# Patient Record
Sex: Male | Born: 1975
Health system: Southern US, Community
[De-identification: ages and names within clinical notes are randomized; demographics above are authoritative.]

## PROBLEM LIST (undated history)

## (undated) DIAGNOSIS — R569 Unspecified convulsions: Secondary | ICD-10-CM

## (undated) HISTORY — DX: Unspecified convulsions: R56.9

---

## 2006-01-28 ENCOUNTER — Emergency Department (HOSPITAL_COMMUNITY): Admission: EM | Admit: 2006-01-28 | Discharge: 2006-01-28 | Payer: Self-pay | Admitting: Family Medicine

## 2006-07-25 ENCOUNTER — Ambulatory Visit: Payer: Self-pay | Admitting: Family Medicine

## 2006-07-31 ENCOUNTER — Ambulatory Visit: Payer: Self-pay | Admitting: Family Medicine

## 2006-08-13 ENCOUNTER — Ambulatory Visit: Payer: Self-pay | Admitting: Family Medicine

## 2006-11-06 ENCOUNTER — Ambulatory Visit: Payer: Self-pay | Admitting: Family Medicine

## 2007-07-09 ENCOUNTER — Ambulatory Visit: Payer: Self-pay | Admitting: Family Medicine

## 2009-12-21 ENCOUNTER — Ambulatory Visit: Payer: Self-pay | Admitting: Family Medicine

## 2010-01-24 ENCOUNTER — Ambulatory Visit: Payer: Self-pay | Admitting: Family Medicine

## 2010-02-22 ENCOUNTER — Ambulatory Visit
Admission: RE | Admit: 2010-02-22 | Discharge: 2010-02-22 | Payer: Self-pay | Source: Home / Self Care | Attending: Family Medicine | Admitting: Family Medicine

## 2012-05-09 ENCOUNTER — Encounter: Payer: Self-pay | Admitting: Family Medicine

## 2012-05-09 ENCOUNTER — Ambulatory Visit (INDEPENDENT_AMBULATORY_CARE_PROVIDER_SITE_OTHER): Payer: BC Managed Care – PPO | Admitting: Family Medicine

## 2012-05-09 VITALS — BP 110/70 | HR 60 | Temp 98.3°F | Wt 157.0 lb

## 2012-05-09 DIAGNOSIS — S0592XA Unspecified injury of left eye and orbit, initial encounter: Secondary | ICD-10-CM

## 2012-05-09 DIAGNOSIS — S0510XA Contusion of eyeball and orbital tissues, unspecified eye, initial encounter: Secondary | ICD-10-CM

## 2012-05-09 DIAGNOSIS — J209 Acute bronchitis, unspecified: Secondary | ICD-10-CM

## 2012-05-09 MED ORDER — AZITHROMYCIN 500 MG PO TABS: 500.0000 mg | ORAL_TABLET | Freq: Every day | ORAL | Status: DC

## 2012-05-09 NOTE — Patient Instructions (Signed)
Take the medicine for the next 3 days and if still having symptoms in one week, call me.. Good hand washing is important Also to let anybody eat or drink after you.

## 2012-05-09 NOTE — Progress Notes (Signed)
  Subjective:    Patient ID: Paul Maxwell, male    DOB: Apr 15, 1975, 37 y.o.   MRN: [id number]  HPI He is here for evaluation of a left eye injury that occurred 6 days ago. Initially he had a discomfort but no pain and a clear discharge from his eyes. No blurred or double vision,however he does have light sensitivity causing a headache. He has used OTC eye drops. Since Saturday he has had rhinorrhea, sneezing and now has a dry cough. He also has had some difficulty with mild headache in the temporal and occipital. He has had some chills and did take his temperature last night which was 101. He states the cough is different than normal when he gets with a URI. He has no known allergies and does not smoke.   Review of Systems     Objective:   Physical Exam alert and in no distress. Tympanic membranes and canals are normal. Throat is clear. Tonsils are normal. Neck is supple without adenopathy or thyromegaly. Cardiac exam shows a regular sinus rhythm without murmurs or gallops. Lungs are clear to auscultation.the left conjunctiva is normal in appearance. Cornea and anterior chamber are normal.        Assessment & Plan:  Acute bronchitis - Plan: azithromycin (ZITHROMAX) 500 MG tablet  Eye trauma, left, initial encounter he is to call me in one week if his congestion and coughing are still present. Explained that his eye has healed and no further therapy necessary. Also discussed good hygiene in regard to potentially infecting other people.

## 2017-05-07 ENCOUNTER — Ambulatory Visit (INDEPENDENT_AMBULATORY_CARE_PROVIDER_SITE_OTHER): Payer: 59 | Admitting: Family Medicine

## 2017-05-07 ENCOUNTER — Encounter: Payer: Self-pay | Admitting: Family Medicine

## 2017-05-07 VITALS — BP 112/72 | HR 73 | Temp 97.5°F | Ht 71.0 in | Wt 160.8 lb

## 2017-05-07 DIAGNOSIS — Z0000 Encounter for general adult medical examination without abnormal findings: Secondary | ICD-10-CM

## 2017-05-07 DIAGNOSIS — Z23 Encounter for immunization: Secondary | ICD-10-CM | POA: Diagnosis not present

## 2017-05-07 LAB — POCT URINALYSIS DIP (PROADVANTAGE DEVICE)
Bilirubin, UA: NEGATIVE
Blood, UA: NEGATIVE
Glucose, UA: NEGATIVE mg/dL
Ketones, POC UA: NEGATIVE mg/dL
LEUKOCYTES UA: NEGATIVE
NITRITE UA: NEGATIVE
PROTEIN UA: NEGATIVE mg/dL
Specific Gravity, Urine: 1.015
UUROB: 3.5
pH, UA: 6 (ref 5.0–8.0)

## 2017-05-07 LAB — CBC WITH DIFFERENTIAL/PLATELET
BASOS: 0 %
Basophils Absolute: 0 10*3/uL (ref 0.0–0.2)
EOS (ABSOLUTE): 0.1 10*3/uL (ref 0.0–0.4)
Eos: 1 %
Hematocrit: 45.4 % (ref 37.5–51.0)
Hemoglobin: 16 g/dL (ref 13.0–17.7)
IMMATURE GRANS (ABS): 0 10*3/uL (ref 0.0–0.1)
Immature Granulocytes: 0 %
LYMPHS ABS: 1.3 10*3/uL (ref 0.7–3.1)
Lymphs: 29 %
MCH: 30.8 pg (ref 26.6–33.0)
MCHC: 35.2 g/dL (ref 31.5–35.7)
MCV: 88 fL (ref 79–97)
MONOS ABS: 0.3 10*3/uL (ref 0.1–0.9)
Monocytes: 7 %
NEUTROS ABS: 2.8 10*3/uL (ref 1.4–7.0)
Neutrophils: 63 %
PLATELETS: 212 10*3/uL (ref 150–379)
RBC: 5.19 x10E6/uL (ref 4.14–5.80)
RDW: 13.2 % (ref 12.3–15.4)
WBC: 4.5 10*3/uL (ref 3.4–10.8)

## 2017-05-07 LAB — COMPREHENSIVE METABOLIC PANEL
A/G RATIO: 1.8 (ref 1.2–2.2)
ALT: 12 IU/L (ref 0–44)
AST: 18 IU/L (ref 0–40)
Albumin: 4.7 g/dL (ref 3.5–5.5)
Alkaline Phosphatase: 44 IU/L (ref 39–117)
BILIRUBIN TOTAL: 0.5 mg/dL (ref 0.0–1.2)
BUN/Creatinine Ratio: 13 (ref 9–20)
BUN: 15 mg/dL (ref 6–24)
CHLORIDE: 100 mmol/L (ref 96–106)
CO2: 25 mmol/L (ref 20–29)
Calcium: 9.4 mg/dL (ref 8.7–10.2)
Creatinine, Ser: 1.14 mg/dL (ref 0.76–1.27)
GFR calc non Af Amer: 80 mL/min/{1.73_m2} (ref >59)
GFR, EST AFRICAN AMERICAN: 92 mL/min/{1.73_m2} (ref >59)
Globulin, Total: 2.6 g/dL (ref 1.5–4.5)
Glucose: 90 mg/dL (ref 65–99)
POTASSIUM: 5.2 mmol/L (ref 3.5–5.2)
Sodium: 141 mmol/L (ref 134–144)
TOTAL PROTEIN: 7.3 g/dL (ref 6.0–8.5)

## 2017-05-07 LAB — LIPID PANEL
Chol/HDL Ratio: 3.4 ratio (ref 0.0–5.0)
Cholesterol, Total: 229 mg/dL — ABNORMAL HIGH (ref 100–199)
HDL: 68 mg/dL (ref >39)
LDL Calculated: 147 mg/dL — ABNORMAL HIGH (ref 0–99)
Triglycerides: 68 mg/dL (ref 0–149)
VLDL Cholesterol Cal: 14 mg/dL (ref 5–40)

## 2017-05-07 NOTE — Progress Notes (Signed)
   Subjective:    Patient ID: Paul Maxwell, male    DOB: 01/05/1976, 42 y.o.   MRN: [id number]  HPI He is here for a complete examination.  He has enjoyed excellent health.  He has no particular concerns or complaints.  He does have 2 children and is scheduled for vasectomy in the near future.  His work and home life are going well.  He has no allergy symptoms, GI or cardiac issues.  Family and social history as well as health maintenance and immunizations was reviewed.  He keeps himself physically active.   Review of Systems  All other systems reviewed and are negative.      Objective:   Physical Exam BP 112/72 (BP Location: Left Arm, Patient Position: Sitting)   Pulse 73   Temp (!) 97.5 F (36.4 C)   Ht 5\' 11"  (1.803 m)   Wt 160 lb 12.8 oz (72.9 kg)   SpO2 99%   BMI 22.43 kg/m   General Appearance:    Alert, cooperative, no distress, appears stated age  Head:    Normocephalic, without obvious abnormality, atraumatic  Eyes:    PERRL, conjunctiva/corneas clear, EOM's intact, fundi    benign  Ears:    Normal TM's and external ear canals  Nose:   Nares normal, mucosa normal, no drainage or sinus   tenderness  Throat:   Lips, mucosa, and tongue normal; teeth and gums normal  Neck:   Supple, no lymphadenopathy;  thyroid:  no   enlargement/tenderness/nodules; no carotid   bruit or JVD     Lungs:     Clear to auscultation bilaterally without wheezes, rales or     ronchi; respirations unlabored      Heart:    Regular rate and rhythm, S1 and S2 normal, no murmur, rub   or gallop     Abdomen:     Soft, non-tender, nondistended, normoactive bowel sounds,    no masses, no hepatosplenomegaly  Genitalia:    Normal male external genitalia without lesions.  Testicles without masses.  No inguinal hernias.  Rectal:   Deferred.  Guaiac cards given  Extremities:   No clubbing, cyanosis or edema  Pulses:   2+ and symmetric all extremities  Skin:   Skin color, texture, turgor normal, no  rashes or lesions  Lymph nodes:   Cervical, supraclavicular, and axillary nodes normal  Neurologic:   CNII-XII intact, normal strength, sensation and gait; reflexes 2+ and symmetric throughout          Psych:   Normal mood, affect, hygiene and grooming.           Assessment & Plan:  Routine general medical examination at a health care facility - Plan: CBC with Differential/Platelet, Comprehensive metabolic panel, Lipid panel  Need for Tdap vaccination - Plan: Tdap vaccine greater than or equal to 7yo IM  Encouraged him to continue to take good care of himself.

## 2017-05-24 DIAGNOSIS — Z302 Encounter for sterilization: Secondary | ICD-10-CM | POA: Diagnosis not present

## 2018-08-19 ENCOUNTER — Ambulatory Visit (INDEPENDENT_AMBULATORY_CARE_PROVIDER_SITE_OTHER): Payer: 59 | Admitting: Medical

## 2018-08-19 ENCOUNTER — Telehealth: Payer: Self-pay | Admitting: *Deleted

## 2018-08-19 ENCOUNTER — Other Ambulatory Visit: Payer: Self-pay

## 2018-08-19 ENCOUNTER — Encounter: Payer: Self-pay | Admitting: Medical

## 2018-08-19 VITALS — Temp 98.4°F | Ht 72.0 in | Wt 155.0 lb

## 2018-08-19 DIAGNOSIS — R5383 Other fatigue: Secondary | ICD-10-CM

## 2018-08-19 DIAGNOSIS — R52 Pain, unspecified: Secondary | ICD-10-CM | POA: Diagnosis not present

## 2018-08-19 DIAGNOSIS — R42 Dizziness and giddiness: Secondary | ICD-10-CM

## 2018-08-19 DIAGNOSIS — Z20822 Contact with and (suspected) exposure to covid-19: Secondary | ICD-10-CM

## 2018-08-19 DIAGNOSIS — J029 Acute pharyngitis, unspecified: Secondary | ICD-10-CM

## 2018-08-19 DIAGNOSIS — R0600 Dyspnea, unspecified: Secondary | ICD-10-CM

## 2018-08-19 DIAGNOSIS — R110 Nausea: Secondary | ICD-10-CM

## 2018-08-19 NOTE — Telephone Encounter (Signed)
Pt scheduled for covid testing 08/20/18 @ 8:30 @ [facility]. Instructions given and order placed

## 2018-08-19 NOTE — Telephone Encounter (Signed)
-----   Message from Carlena Hurl, PA-C sent at 08/19/2018 11:25 AM EDT ----- Regarding: covid testing Please call patient for Covid19 testing.  Lethargy, mild shortness of breath, sore throat, decreased appetite, dizzy, body ache, nausea x 3 days

## 2018-08-19 NOTE — Progress Notes (Signed)
Subjective:     Patient ID: Paul Maxwell, male   DOB: [birth year], 43 y.o.   MRN: [id number]  This visit type was conducted due to national recommendations for restrictions regarding the COVID-19 Pandemic (e.g. social distancing) in an effort to limit this patient's exposure and mitigate transmission in our community.  Due to their co-morbid illnesses, this patient is at least at moderate risk for complications without adequate follow up.  This format is felt to be most appropriate for this patient at this time.    Documentation for virtual audio and video telecommunications through Zoom encounter:  The patient was located at home. The provider was located in the office. The patient did consent to this visit and is aware of possible charges through their insurance for this visit.  The other persons participating in this telemedicine service were none. Time spent on call was 15 minutes and in review of previous records >15 minutes total.  This virtual service is not related to other E/M service within previous 7 days.   HPI Chief Complaint  Patient presents with  . headache    headache, nausea,dizzy, back pain, tightness chest, sob,  fatigue and loss of appetite X Friday    Virtual consult today for not feeling well.  He reports 3-day history of feeling lethargic, no energy, decreased appetite, sore throat on the left, low back pain and body aches, mild shortness of breath, nausea, dizziness.  Denies fever, no cough, no decrease in smell or taste.  No loose stool.  No other symptoms.  He manages to [place].  He denies specific sick contacts.  He has 2 children at home ages 44 and 8 years old.  His 62-year-old was at [place] about 2 weeks ago but was not sick.  No strep throat contacts.  No recent tick bites.  No rash.  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  This was with a group of 4 other people.  He also interacts  with homeless people [place] on a regular basis.  He is wearing a mask indoors at work but not necessarily outside.  His father was diagnosed with coronavirus back in April, but he did not get exposed as far as he knows.  No other aggravating or relieving factors. No other complaint.  No past medical history on file. No current outpatient medications on file prior to visit.   No current facility-administered medications on file prior to visit.      Review of Systems As in subjective    Objective:   Physical Exam Due to coronavirus pandemic stay at home measures, patient visit was virtual and they were not examined in person.   Temp 98.4 F (36.9 C) (Oral)   Ht 6' (1.829 m)   Wt 155 lb (70.3 kg)   BMI 21.02 kg/m   Gen: wd, wn, nad, white male     Assessment:     Encounter Diagnoses  Name Primary?  Marland Kitchen Dyspnea, unspecified type Yes  . Fatigue, unspecified type   . Dizziness   . Body aches   . Sore throat   . Nausea        Plan:     We discussed symptoms that could suggest viral pharyngitis vs other viral syndrome vs other.  He will send me throat pic through email.  I think tonsillitis vs strep throat seems less likely.  These symptoms could be Covid19 related.  I am concerned given his exposure to homeless population down town without a mask, and give his exposure doing CPR breathing techniques in a closed room last week during CPR class.   I will place order [facility] Covid testing [facility].    General recommendations: I recommend you rest, hydrate well with water and clear fluids throughout the day.  You can use Tylenol for pain or fever.  We will await test results.   If we get back a negative test result, we will call back with other recommendations and plan to return to work.      Self Quarantine: In general I recommend you avoid contact with people as much as possible for the foreseeable next 2 weeks if you test positive.   Particularly in your  house, isolate your self from others in a separate room, wear a mask when possible in the room, particularly if coughing a lot.   Have others bring food, water, medications, etc., to your door but avoid direct contact with your household contacts during this time to avoid spreading the infection to them.   If you have a separate bathroom and living quarters during the next 2 weeks away from others, that would be preferable.    If you can't completely isolate, then wear a mask, wash hands frequently with soap and water for at least 15 seconds, minimize close contact with others, and have a friend or family member check regularly from a distance to make sure you are not getting seriously worse.     You should not be going out in public, should not be going to stores, to work or other public places until all your symptoms have resolved and at least 7 days + 72 hours of no symptoms at all have transpired.   Ideally you should avoid contact with others for a full 14 days if possible.  One of the goals is to limit spread to high risk people; people that are older and elderly, people with multiple health issues like diabetes, heart disease, lung disease, and anybody that has weakened immune systems such as people with cancer or on immunosuppressive therapy.   Please call back if you have questions and concerns.     Jill AlexandersJustin was seen today for headache.  Diagnoses and all orders for this visit:  Dyspnea, unspecified type  Fatigue, unspecified type  Dizziness  Body aches  Sore throat  Nausea

## 2018-08-20 ENCOUNTER — Other Ambulatory Visit: Payer: Self-pay

## 2018-08-20 DIAGNOSIS — Z20822 Contact with and (suspected) exposure to covid-19: Secondary | ICD-10-CM

## 2018-08-26 ENCOUNTER — Telehealth: Payer: Self-pay | Admitting: Medical

## 2018-08-26 NOTE — Telephone Encounter (Signed)
Please check on Covid test result?

## 2018-08-27 LAB — NOVEL CORONAVIRUS, NAA: SARS-CoV-2, NAA: NOT DETECTED

## 2018-08-27 NOTE — Telephone Encounter (Signed)
No test results as of yet.   They are behind on testing.

## 2018-08-27 NOTE — Telephone Encounter (Signed)
Left message for patient to let him know that we called about his Covid testing and its not back yet.

## 2018-08-27 NOTE — Telephone Encounter (Signed)
Please let him know we called to check

## 2018-10-10 ENCOUNTER — Other Ambulatory Visit (INDEPENDENT_AMBULATORY_CARE_PROVIDER_SITE_OTHER): Payer: 59

## 2018-10-10 ENCOUNTER — Other Ambulatory Visit: Payer: Self-pay

## 2018-10-10 DIAGNOSIS — Z23 Encounter for immunization: Secondary | ICD-10-CM | POA: Diagnosis not present

## 2019-07-24 ENCOUNTER — Emergency Department (HOSPITAL_COMMUNITY): Payer: 59

## 2019-07-24 ENCOUNTER — Emergency Department (HOSPITAL_COMMUNITY)
Admission: EM | Admit: 2019-07-24 | Discharge: 2019-07-24 | Disposition: A | Discharge status: Home / Self Care | Payer: 59 | Attending: Emergency Medicine | Admitting: Emergency Medicine

## 2019-07-24 ENCOUNTER — Encounter (HOSPITAL_COMMUNITY): Payer: Self-pay

## 2019-07-24 ENCOUNTER — Ambulatory Visit (HOSPITAL_COMMUNITY)
Admission: EM | Admit: 2019-07-24 | Discharge: 2019-07-24 | Disposition: A | Discharge status: Home / Self Care | Payer: Self-pay

## 2019-07-24 ENCOUNTER — Other Ambulatory Visit: Payer: Self-pay

## 2019-07-24 DIAGNOSIS — Y9355 Activity, bike riding: Secondary | ICD-10-CM | POA: Insufficient documentation

## 2019-07-24 DIAGNOSIS — M79641 Pain in right hand: Secondary | ICD-10-CM | POA: Insufficient documentation

## 2019-07-24 DIAGNOSIS — Y998 Other external cause status: Secondary | ICD-10-CM | POA: Insufficient documentation

## 2019-07-24 DIAGNOSIS — Y92838 Other recreation area as the place of occurrence of the external cause: Secondary | ICD-10-CM | POA: Insufficient documentation

## 2019-07-24 DIAGNOSIS — S022XXA Fracture of nasal bones, initial encounter for closed fracture: Secondary | ICD-10-CM | POA: Diagnosis not present

## 2019-07-24 DIAGNOSIS — S0181XA Laceration without foreign body of other part of head, initial encounter: Secondary | ICD-10-CM | POA: Insufficient documentation

## 2019-07-24 DIAGNOSIS — W19XXXA Unspecified fall, initial encounter: Secondary | ICD-10-CM

## 2019-07-24 DIAGNOSIS — Z23 Encounter for immunization: Secondary | ICD-10-CM | POA: Diagnosis not present

## 2019-07-24 DIAGNOSIS — S0990XA Unspecified injury of head, initial encounter: Secondary | ICD-10-CM | POA: Diagnosis present

## 2019-07-24 MED ORDER — ACETAMINOPHEN 325 MG PO TABS
650.0000 mg | ORAL_TABLET | Freq: Once | ORAL | Status: AC
  Administered 2019-07-24: 650 mg via ORAL
  Filled 2019-07-24: qty 2

## 2019-07-24 MED ORDER — OXYMETAZOLINE HCL 0.05 % NA SOLN
1.0000 | Freq: Once | NASAL | Status: DC
  Filled 2019-07-24: qty 30

## 2019-07-24 MED ORDER — TETANUS-DIPHTH-ACELL PERTUSSIS 5-2.5-18.5 LF-MCG/0.5 IM SUSP
0.5000 mL | Freq: Once | INTRAMUSCULAR | Status: AC
  Administered 2019-07-24: 0.5 mL via INTRAMUSCULAR
  Filled 2019-07-24: qty 0.5
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
## 2019-07-24 NOTE — Discharge Instructions (Signed)
Follow up with the ear nose and throat doctor next week.   Please return to the emergency department for any new or worsening symptoms.
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
## 2021-03-10 ENCOUNTER — Other Ambulatory Visit: Payer: Self-pay

## 2021-03-10 ENCOUNTER — Emergency Department (HOSPITAL_COMMUNITY)
Admission: EM | Admit: 2021-03-10 | Discharge: 2021-03-11 | Disposition: A | Discharge status: Home / Self Care | Payer: 59 | Attending: Emergency Medicine | Admitting: Emergency Medicine

## 2021-03-10 DIAGNOSIS — R1031 Right lower quadrant pain: Secondary | ICD-10-CM | POA: Insufficient documentation

## 2021-03-10 DIAGNOSIS — R1030 Lower abdominal pain, unspecified: Secondary | ICD-10-CM

## 2021-03-10 LAB — URINALYSIS, ROUTINE W REFLEX MICROSCOPIC
Bilirubin Urine: NEGATIVE
Glucose, UA: NEGATIVE mg/dL
Hgb urine dipstick: NEGATIVE
Ketones, ur: NEGATIVE mg/dL
Leukocytes,Ua: NEGATIVE
Nitrite: NEGATIVE
Protein, ur: NEGATIVE mg/dL
Specific Gravity, Urine: 1.023 (ref 1.005–1.030)
pH: 7 (ref 5.0–8.0)

## 2021-03-10 LAB — CBC WITH DIFFERENTIAL/PLATELET
Abs Immature Granulocytes: 0.01 10*3/uL (ref 0.00–0.07)
Basophils Absolute: 0 10*3/uL (ref 0.0–0.1)
Basophils Relative: 1 %
Eosinophils Absolute: 0.2 10*3/uL (ref 0.0–0.5)
Eosinophils Relative: 3 %
HCT: 43.3 % (ref 39.0–52.0)
Hemoglobin: 14.7 g/dL (ref 13.0–17.0)
Immature Granulocytes: 0 %
Lymphocytes Relative: 24 %
Lymphs Abs: 1.4 10*3/uL (ref 0.7–4.0)
MCH: 30.5 pg (ref 26.0–34.0)
MCHC: 33.9 g/dL (ref 30.0–36.0)
MCV: 89.8 fL (ref 80.0–100.0)
Monocytes Absolute: 0.5 10*3/uL (ref 0.1–1.0)
Monocytes Relative: 9 %
Neutro Abs: 3.6 10*3/uL (ref 1.7–7.7)
Neutrophils Relative %: 63 %
Platelets: 190 10*3/uL (ref 150–400)
RBC: 4.82 MIL/uL (ref 4.22–5.81)
RDW: 12.4 % (ref 11.5–15.5)
WBC: 5.8 10*3/uL (ref 4.0–10.5)
nRBC: 0 % (ref 0.0–0.2)

## 2021-03-10 LAB — COMPREHENSIVE METABOLIC PANEL
ALT: 15 U/L (ref 0–44)
AST: 21 U/L (ref 15–41)
Albumin: 4.2 g/dL (ref 3.5–5.0)
Alkaline Phosphatase: 48 U/L (ref 38–126)
Anion gap: 11 (ref 5–15)
BUN: 18 mg/dL (ref 6–20)
CO2: 24 mmol/L (ref 22–32)
Calcium: 9.3 mg/dL (ref 8.9–10.3)
Chloride: 102 mmol/L (ref 98–111)
Creatinine, Ser: 1.02 mg/dL (ref 0.61–1.24)
GFR, Estimated: >60 mL/min (ref >60)
Glucose, Bld: 147 mg/dL — ABNORMAL HIGH (ref 70–99)
Potassium: 3.8 mmol/L (ref 3.5–5.1)
Sodium: 137 mmol/L (ref 135–145)
Total Bilirubin: 0.7 mg/dL (ref 0.3–1.2)
Total Protein: 6.6 g/dL (ref 6.5–8.1)

## 2021-03-10 LAB — LIPASE, BLOOD: Lipase: 73 U/L — ABNORMAL HIGH (ref 11–51)

## 2021-03-10 NOTE — ED Provider Triage Note (Signed)
Emergency Medicine Provider Triage Evaluation Note  MAGNUM LUNDE , a 46 y.o. male  was evaluated in triage.  Pt complains of lower abdominal pain that began this evening.  Some nausea but no vomiting.  No prior surgeries.  Otherwise healthy.  Review of Systems  Positive: Abdominal pain, nausea Negative: fever  Physical Exam  BP (!) 145/47 (BP Location: Right Arm)    Pulse 66    Temp 98.4 F (36.9 C) (Oral)    Resp 16    SpO2 99%   Gen:   Awake, no distress   Resp:  Normal effort  MSK:   Moves extremities without difficulty  Other:  Mildly tender RLQ and periumbilical region  Medical Decision Making  Medically screening exam initiated at 10:31 PM.  Appropriate orders placed.  NATTHEW MARLATT was informed that the remainder of the evaluation will be completed by another provider, this initial triage assessment does not replace that evaluation, and the importance of remaining in the ED until their evaluation is complete.  Abdominal pain, more RLQ and periumbilical.  Will check labs and CT.   Garlon Hatchet, PA-C 03/10/21 2232

## 2021-03-10 NOTE — ED Triage Notes (Signed)
Pt c/o acute onset of right lower abdominal pain onset approx 45 min to arrival. Tenderness on palpation. Denies bowel/urinary problems. No cough/fever/chill.s no abd surgery.

## 2021-03-11 ENCOUNTER — Emergency Department (HOSPITAL_COMMUNITY): Payer: 59

## 2021-03-11 MED ORDER — IOHEXOL 300 MG/ML  SOLN
100.0000 mL | Freq: Once | INTRAMUSCULAR | Status: AC | PRN
  Administered 2021-03-11: 100 mL via INTRAVENOUS

## 2021-03-11 NOTE — ED Notes (Signed)
Reviewed discharge instructions with patient. Follow-up care and medications reviewed. Patient  verbalized understanding. Patient A&Ox4, VSS, and ambulatory with steady gait upon discharge.  °

## 2021-03-11 NOTE — Discharge Instructions (Signed)
Take 8 scoops of miralax in 32oz of whatever you would like to drink.(Gatorade comes in this size) You can also use a fleets enema which you can buy over the counter at the pharmacy.  Return for worsening abdominal pain, vomiting or fever. ? ?

## 2021-03-11 NOTE — ED Notes (Signed)
Patient denies pain and is resting comfortably.  

## 2021-03-11 NOTE — ED Provider Notes (Signed)
[facility] Provider Note   CSN: [id number] Arrival date & time: 03/10/21  2208     History  Chief Complaint  Patient presents with   Abdominal Pain    [facility] is a 46 y.o. male.  46 yo M with a chief complaints of severe abdominal cramping.  This occurred yesterday and he felt it was worse in the right lower quadrant.  He was concerned about possible appendicitis and came for evaluation.  Denies fevers denies vomiting.  Denies constipation.  Denies any change to his diet.  No suspicious food intake no recent travel.  He denies any pain to the penis or the testicles.  The history is provided by the patient.  Abdominal Pain Pain location:  RLQ Pain quality: cramping   Pain radiates to:  Does not radiate Pain severity:  Severe Onset quality:  Sudden Duration:  4 hours Timing:  Rare Progression:  Resolved Chronicity:  New Relieved by:  Nothing Worsened by:  Nothing Ineffective treatments:  None tried Associated symptoms: no chest pain, no chills, no diarrhea, no fever, no shortness of breath and no vomiting       Home Medications Prior to Admission medications   Not on File      Allergies    Patient has no known allergies.    Review of Systems   Review of Systems  Constitutional:  Negative for chills and fever.  HENT:  Negative for congestion and facial swelling.   Eyes:  Negative for discharge and visual disturbance.  Respiratory:  Negative for shortness of breath.   Cardiovascular:  Negative for chest pain and palpitations.  Gastrointestinal:  Positive for abdominal pain. Negative for diarrhea and vomiting.  Musculoskeletal:  Negative for arthralgias and myalgias.  Skin:  Negative for color change and rash.  Neurological:  Negative for tremors, syncope and headaches.  Psychiatric/Behavioral:  Negative for confusion and dysphoric mood.    Physical Exam Updated Vital Signs BP 116/76    Pulse 63    Temp 98.4 F  (36.9 C) (Oral)    Resp 18    Ht 6' (1.829 m)    Wt 70.3 kg    SpO2 100%    BMI 21.02 kg/m  Physical Exam Vitals and nursing note reviewed.  Constitutional:      Appearance: He is well-developed.  HENT:     Head: Normocephalic and atraumatic.  Eyes:     Pupils: Pupils are equal, round, and reactive to light.  Neck:     Vascular: No JVD.  Cardiovascular:     Rate and Rhythm: Normal rate and regular rhythm.     Heart sounds: No murmur heard.   No friction rub. No gallop.  Pulmonary:     Effort: No respiratory distress.     Breath sounds: No wheezing.  Abdominal:     General: There is no distension.     Tenderness: There is no abdominal tenderness. There is no guarding or rebound.     Comments: Benign abdominal exam.  No pain specifically with deep palpation at McBurney's point.  Musculoskeletal:        General: Normal range of motion.     Cervical back: Normal range of motion and neck supple.  Skin:    Coloration: Skin is not pale.     Findings: No rash.  Neurological:     Mental Status: He is alert and oriented to person, place, and time.  Psychiatric:  Behavior: Behavior normal.    ED Results / Procedures / Treatments   Labs (all labs ordered are listed, but only abnormal results are displayed) Labs Reviewed  COMPREHENSIVE METABOLIC PANEL - Abnormal; Notable for the following components:      Result Value   Glucose, Bld 147 (*)    All other components within normal limits  LIPASE, BLOOD - Abnormal; Notable for the following components:   Lipase 73 (*)    All other components within normal limits  URINALYSIS, ROUTINE W REFLEX MICROSCOPIC - Abnormal; Notable for the following components:   APPearance HAZY (*)    All other components within normal limits  CBC WITH DIFFERENTIAL/PLATELET    EKG None  Radiology CT ABDOMEN PELVIS W CONTRAST  Result Date: 03/11/2021 CLINICAL DATA:  Right lower quadrant pain for 1 hour, initial encounter EXAM: CT ABDOMEN AND  PELVIS WITH CONTRAST TECHNIQUE: Multidetector CT imaging of the abdomen and pelvis was performed using the standard protocol following bolus administration of intravenous contrast. RADIATION DOSE REDUCTION: This exam was performed according to the departmental dose-optimization program which includes automated exposure control, adjustment of the mA and/or kV according to patient size and/or use of iterative reconstruction technique. CONTRAST:  122mL OMNIPAQUE IOHEXOL 300 MG/ML  SOLN COMPARISON:  None. FINDINGS: Lower chest: No acute abnormality. Hepatobiliary: No focal liver abnormality is seen. No gallstones, gallbladder wall thickening, or biliary dilatation. Pancreas: Unremarkable. No pancreatic ductal dilatation or surrounding inflammatory changes. Spleen: Normal in size without focal abnormality. Adrenals/Urinary Tract: Adrenal glands are within normal limits. Kidneys demonstrate a normal enhancement pattern bilaterally. No renal calculi are noted. No obstructive changes are noted. The bladder is decompressed. Stomach/Bowel: No obstructive or inflammatory changes of the colon are seen. The appendix is well visualized. No inflammatory changes to suggest appendicitis are noted. Small bowel and stomach appear within normal limits. Vascular/Lymphatic: No significant vascular findings are present. No enlarged abdominal or pelvic lymph nodes. Reproductive: Prostate is unremarkable. Other: No abdominal wall hernia or abnormality. No abdominopelvic ascites. Musculoskeletal: No acute or significant osseous findings. IMPRESSION: Normal-appearing appendix. No focal abnormality is noted. Electronically Signed   By: Inez Catalina M.D.   On: 03/11/2021 00:22    Procedures Procedures    Medications Ordered in ED Medications  iohexol (OMNIPAQUE) 300 MG/ML solution 100 mL (100 mLs Intravenous Contrast Given 03/11/21 0017)    ED Course/ Medical Decision Making/ A&P                           Medical Decision  Making  46 yo M with a chief complaints of abdominal pain.  He describes it as a severe cramping episode that occurred and then has almost completely resolved.  He has a very benign abdominal exam.  He had a work-up performed in triage with no elevation to his LFTs his lipase is mildly elevated no leukocytosis UA without infection.  He had a CT scan of the abdomen pelvis with contrast that was negative for appendicitis or any other obvious intra-abdominal pathology.  On my review of the images the patient had a bit of an increased stool burden in the right lower quadrant.  I discussed this with him and he we will consider a trial of laxatives.  The patient's medical record was reviewed and he otherwise seems fairly healthy his most recent visit was to ENT for a nasal bone fracture.  9:00 AM:  I have discussed the diagnosis/risks/treatment options with the patient  and believe the pt to be eligible for discharge home to follow-up with PCP. We also discussed returning to the ED immediately if new or worsening sx occur. We discussed the sx which are most concerning (e.g., sudden worsening pain, fever, inability to tolerate by mouth) that necessitate immediate return. Medications administered to the patient during their visit and any new prescriptions provided to the patient are listed below.  Medications given during this visit Medications  iohexol (OMNIPAQUE) 300 MG/ML solution 100 mL (100 mLs Intravenous Contrast Given 03/11/21 0017)     The patient appears reasonably screen and/or stabilized for discharge and I doubt any other medical condition or other [facility] requiring further screening, evaluation, or treatment in the ED at this time prior to discharge.          Final Clinical Impression(s) / ED Diagnoses Final diagnoses:  Lower abdominal pain    Rx / DC Orders ED Discharge Orders     None         Deno Etienne, DO 03/11/21 0900

## 2021-10-26 ENCOUNTER — Encounter: Payer: Self-pay | Admitting: Internal Medicine

## 2021-11-29 ENCOUNTER — Encounter: Payer: Self-pay | Admitting: Internal Medicine

## 2021-12-12 ENCOUNTER — Encounter: Payer: Self-pay | Admitting: Internal Medicine

## 2022-01-02 ENCOUNTER — Other Ambulatory Visit: Payer: 59

## 2022-02-07 ENCOUNTER — Encounter: Payer: Self-pay | Admitting: Family Medicine

## 2022-02-07 ENCOUNTER — Ambulatory Visit (INDEPENDENT_AMBULATORY_CARE_PROVIDER_SITE_OTHER): Payer: BC Managed Care – PPO | Admitting: Family Medicine

## 2022-02-07 VITALS — BP 118/72 | HR 60 | Ht 70.5 in | Wt 154.8 lb

## 2022-02-07 DIAGNOSIS — Z23 Encounter for immunization: Secondary | ICD-10-CM | POA: Diagnosis not present

## 2022-02-07 DIAGNOSIS — Z0000 Encounter for general adult medical examination without abnormal findings: Secondary | ICD-10-CM | POA: Diagnosis not present

## 2022-02-07 DIAGNOSIS — Z9852 Vasectomy status: Secondary | ICD-10-CM

## 2022-02-07 DIAGNOSIS — Z1211 Encounter for screening for malignant neoplasm of colon: Secondary | ICD-10-CM

## 2022-02-07 LAB — POCT URINALYSIS DIP (PROADVANTAGE DEVICE)
Bilirubin, UA: NEGATIVE
Blood, UA: NEGATIVE
Glucose, UA: NEGATIVE mg/dL
Ketones, POC UA: NEGATIVE mg/dL
Leukocytes, UA: NEGATIVE
Nitrite, UA: NEGATIVE
Specific Gravity, Urine: 1.02
Urobilinogen, Ur: 0.2
pH, UA: 6 (ref 5.0–8.0)

## 2022-02-07 NOTE — Progress Notes (Signed)
Complete physical exam  Patient: Paul [facility]   DOB: Mar 31, 1975   46 y.o. Male  MRN: [id number]  Subjective:    Chief Complaint  Patient presents with   Annual Exam    Fasting. No additional concerns.     [facility] is a 46 y.o. male who presents today for a complete physical exam. He reports consuming a general diet. Home exercise routine includes bike riding. He generally feels well. He reports sleeping fairly well. He states that things are going well and he is considering applying for a different job at work.  He does not smoke, drinks socially.  Does not do drugs.  He has had a vasectomy.  His marriage is going well.  Work is going well.  Otherwise his family and social history as well as health maintenance and immunizations was reviewed.   Most recent fall risk assessment:    02/07/2022   10:58 AM  Fall Risk   Falls in the past year? 0  Number falls in past yr: 0  Injury with Fall? 0  Risk for fall due to : No Fall Risks  Follow up Falls evaluation completed     Most recent depression screenings:    02/07/2022   10:58 AM  PHQ 2/9 Scores  PHQ - 2 Score 0    Vision:Not within last year  and Dental: No current dental problems and Receives regular dental care    Patient Care Team: Ronnald Nian, MD as PCP - General (Family Medicine)   No outpatient medications prior to visit.   No facility-administered medications prior to visit.    Review of Systems  All other systems reviewed and are negative.         Objective:     BP 118/72   Pulse 60   Ht 5' 10.5" (1.791 m)   Wt 154 lb 12.8 oz (70.2 kg)   SpO2 98%   BMI 21.90 kg/m    Physical Exam  Alert and in no distress. Tympanic membranes and canals are normal. Pharyngeal area is normal. Neck is supple without adenopathy or thyromegaly. Cardiac exam shows a regular sinus rhythm without murmurs or gallops. Lungs are clear to auscultation.  Results for orders placed or performed in visit on  02/07/22  POCT Urinalysis DIP (Proadvantage Device)  Result Value Ref Range   Color, UA straw (A) yellow   Clarity, UA clear clear   Glucose, UA negative negative mg/dL   Bilirubin, UA negative negative   Ketones, POC UA negative negative mg/dL   Specific Gravity, Urine 1.020    Blood, UA negative negative   pH, UA 6.0 5.0 - 8.0   Protein Ur, POC trace (A) negative mg/dL   Urobilinogen, Ur 0.2    Nitrite, UA Negative Negative   Leukocytes, UA Negative Negative       Assessment & Plan:   Routine general medical examination at a health care facility - Plan: POCT Urinalysis DIP (Proadvantage Device), CBC with Differential/Platelet, Comprehensive metabolic panel, Lipid panel  Need for influenza vaccination - Plan: Flu Vaccine QUAD 6+ mos PF IM (Fluarix Quad PF)  Need for COVID-19 vaccine - Plan: Pfizer Fall 2023 Covid-19 Vaccine 41yrs and older  Screening for colon cancer - Plan: Cologuard  History of vasectomy Encouraged him to continue to take good care of himself.  Will do routine blood screening and update his immunizations.  Immunization History  Administered Date(s) Administered   COVID-19, mRNA, vaccine(Comirnaty)12 years and older 02/07/2022  Influenza,inj,Quad PF,6+ Mos 10/10/2018, 02/07/2022   Tdap 05/07/2017, 07/24/2019    Health Maintenance  Topic Date Due   HIV Screening  Never done   Hepatitis C Screening  Never done   DTaP/Tdap/Td (3 - Td or Tdap) 07/23/2029   INFLUENZA VACCINE  Completed   COVID-19 Vaccine  Completed   HPV VACCINES  Aged Out    Discussed health benefits of physical activity, and encouraged him to engage in regular exercise appropriate for his age and condition.  Problem List Items Addressed This Visit   None Visit Diagnoses     Routine general medical examination at a health care facility    -  Primary   Relevant Orders   POCT Urinalysis DIP (Proadvantage Device) (Completed)   CBC with Differential/Platelet   Comprehensive  metabolic panel   Lipid panel   Need for influenza vaccination       Relevant Orders   Flu Vaccine QUAD 6+ mos PF IM (Fluarix Quad PF) (Completed)   Need for COVID-19 vaccine       Relevant Orders   Pfizer Fall 2023 Covid-19 Vaccine 10yrs and older (Completed)   Screening for colon cancer       Relevant Orders   Cologuard      No follow-ups on file.     Sharlot Gowda, MD

## 2022-02-08 LAB — COMPREHENSIVE METABOLIC PANEL
ALT: 11 IU/L (ref 0–44)
AST: 20 IU/L (ref 0–40)
Albumin/Globulin Ratio: 2.3 — ABNORMAL HIGH (ref 1.2–2.2)
Albumin: 4.8 g/dL (ref 4.1–5.1)
Alkaline Phosphatase: 39 IU/L — ABNORMAL LOW (ref 44–121)
BUN/Creatinine Ratio: 12 (ref 9–20)
BUN: 14 mg/dL (ref 6–24)
Bilirubin Total: 0.8 mg/dL (ref 0.0–1.2)
CO2: 24 mmol/L (ref 20–29)
Calcium: 9.8 mg/dL (ref 8.7–10.2)
Chloride: 102 mmol/L (ref 96–106)
Creatinine, Ser: 1.13 mg/dL (ref 0.76–1.27)
Globulin, Total: 2.1 g/dL (ref 1.5–4.5)
Glucose: 85 mg/dL (ref 70–99)
Potassium: 4.9 mmol/L (ref 3.5–5.2)
Sodium: 139 mmol/L (ref 134–144)
Total Protein: 6.9 g/dL (ref 6.0–8.5)
eGFR: 82 mL/min/{1.73_m2} (ref >59)

## 2022-02-08 LAB — CBC WITH DIFFERENTIAL/PLATELET
Basophils Absolute: 0 10*3/uL (ref 0.0–0.2)
Basos: 1 %
EOS (ABSOLUTE): 0.1 10*3/uL (ref 0.0–0.4)
Eos: 1 %
Hematocrit: 47.3 % (ref 37.5–51.0)
Hemoglobin: 16.1 g/dL (ref 13.0–17.7)
Immature Grans (Abs): 0 10*3/uL (ref 0.0–0.1)
Immature Granulocytes: 0 %
Lymphocytes Absolute: 1.2 10*3/uL (ref 0.7–3.1)
Lymphs: 23 %
MCH: 30.3 pg (ref 26.6–33.0)
MCHC: 34 g/dL (ref 31.5–35.7)
MCV: 89 fL (ref 79–97)
Monocytes Absolute: 0.4 10*3/uL (ref 0.1–0.9)
Monocytes: 8 %
Neutrophils Absolute: 3.5 10*3/uL (ref 1.4–7.0)
Neutrophils: 67 %
Platelets: 242 10*3/uL (ref 150–450)
RBC: 5.31 x10E6/uL (ref 4.14–5.80)
RDW: 12.4 % (ref 11.6–15.4)
WBC: 5.2 10*3/uL (ref 3.4–10.8)

## 2022-02-08 LAB — LIPID PANEL
Chol/HDL Ratio: 3.4 ratio (ref 0.0–5.0)
Cholesterol, Total: 234 mg/dL — ABNORMAL HIGH (ref 100–199)
HDL: 69 mg/dL (ref >39)
LDL Chol Calc (NIH): 153 mg/dL — ABNORMAL HIGH (ref 0–99)
Triglycerides: 70 mg/dL (ref 0–149)
VLDL Cholesterol Cal: 12 mg/dL (ref 5–40)

## 2022-09-07 ENCOUNTER — Other Ambulatory Visit: Payer: Self-pay

## 2022-09-07 ENCOUNTER — Emergency Department (HOSPITAL_COMMUNITY)
Admission: EM | Admit: 2022-09-07 | Discharge: 2022-09-07 | Disposition: A | Discharge status: Home / Self Care | Payer: BC Managed Care – PPO | Attending: Emergency Medicine | Admitting: Emergency Medicine

## 2022-09-07 ENCOUNTER — Emergency Department (HOSPITAL_COMMUNITY): Payer: BC Managed Care – PPO

## 2022-09-07 DIAGNOSIS — R0902 Hypoxemia: Secondary | ICD-10-CM | POA: Diagnosis not present

## 2022-09-07 DIAGNOSIS — R258 Other abnormal involuntary movements: Secondary | ICD-10-CM | POA: Insufficient documentation

## 2022-09-07 DIAGNOSIS — E872 Acidosis, unspecified: Secondary | ICD-10-CM | POA: Diagnosis not present

## 2022-09-07 DIAGNOSIS — K5641 Fecal impaction: Secondary | ICD-10-CM | POA: Diagnosis not present

## 2022-09-07 DIAGNOSIS — R569 Unspecified convulsions: Secondary | ICD-10-CM | POA: Diagnosis not present

## 2022-09-07 DIAGNOSIS — R404 Transient alteration of awareness: Secondary | ICD-10-CM | POA: Diagnosis not present

## 2022-09-07 DIAGNOSIS — M542 Cervicalgia: Secondary | ICD-10-CM | POA: Diagnosis not present

## 2022-09-07 DIAGNOSIS — S199XXA Unspecified injury of neck, initial encounter: Secondary | ICD-10-CM | POA: Diagnosis not present

## 2022-09-07 DIAGNOSIS — R4182 Altered mental status, unspecified: Secondary | ICD-10-CM | POA: Diagnosis not present

## 2022-09-07 DIAGNOSIS — W19XXXA Unspecified fall, initial encounter: Secondary | ICD-10-CM | POA: Diagnosis not present

## 2022-09-07 DIAGNOSIS — S0291XA Unspecified fracture of skull, initial encounter for closed fracture: Secondary | ICD-10-CM | POA: Diagnosis not present

## 2022-09-07 DIAGNOSIS — R9431 Abnormal electrocardiogram [ECG] [EKG]: Secondary | ICD-10-CM | POA: Diagnosis not present

## 2022-09-07 DIAGNOSIS — R55 Syncope and collapse: Secondary | ICD-10-CM | POA: Diagnosis not present

## 2022-09-07 DIAGNOSIS — J9811 Atelectasis: Secondary | ICD-10-CM | POA: Diagnosis not present

## 2022-09-07 DIAGNOSIS — I6782 Cerebral ischemia: Secondary | ICD-10-CM | POA: Diagnosis not present

## 2022-09-07 DIAGNOSIS — S022XXA Fracture of nasal bones, initial encounter for closed fracture: Secondary | ICD-10-CM | POA: Diagnosis not present

## 2022-09-07 LAB — URINALYSIS, ROUTINE W REFLEX MICROSCOPIC
Bacteria, UA: NONE SEEN
Bilirubin Urine: NEGATIVE
Glucose, UA: NEGATIVE mg/dL
Ketones, ur: 5 mg/dL — AB
Leukocytes,Ua: NEGATIVE
Nitrite: NEGATIVE
Protein, ur: 30 mg/dL — AB
Specific Gravity, Urine: >1.046 — ABNORMAL HIGH (ref 1.005–1.030)
pH: 5 (ref 5.0–8.0)

## 2022-09-07 LAB — I-STAT CHEM 8, ED
BUN: 19 mg/dL (ref 6–20)
Calcium, Ion: 1.08 mmol/L — ABNORMAL LOW (ref 1.15–1.40)
Chloride: 107 mmol/L (ref 98–111)
Creatinine, Ser: 1.4 mg/dL — ABNORMAL HIGH (ref 0.61–1.24)
Glucose, Bld: 183 mg/dL — ABNORMAL HIGH (ref 70–99)
HCT: 46 % (ref 39.0–52.0)
Hemoglobin: 15.6 g/dL (ref 13.0–17.0)
Potassium: 4.3 mmol/L (ref 3.5–5.1)
Sodium: 138 mmol/L (ref 135–145)
TCO2: 14 mmol/L — ABNORMAL LOW (ref 22–32)

## 2022-09-07 LAB — I-STAT VENOUS BLOOD GAS, ED
Acid-base deficit: 16 mmol/L — ABNORMAL HIGH (ref 0.0–2.0)
Bicarbonate: 11.3 mmol/L — ABNORMAL LOW (ref 20.0–28.0)
Calcium, Ion: 1.08 mmol/L — ABNORMAL LOW (ref 1.15–1.40)
HCT: 46 % (ref 39.0–52.0)
Hemoglobin: 15.6 g/dL (ref 13.0–17.0)
O2 Saturation: 95 %
Potassium: 4.3 mmol/L (ref 3.5–5.1)
Sodium: 137 mmol/L (ref 135–145)
TCO2: 12 mmol/L — ABNORMAL LOW (ref 22–32)
pCO2, Ven: 32.1 mmHg — ABNORMAL LOW (ref 44–60)
pH, Ven: 7.154 — CL (ref 7.25–7.43)
pO2, Ven: 92 mmHg — ABNORMAL HIGH (ref 32–45)

## 2022-09-07 LAB — COMPREHENSIVE METABOLIC PANEL
ALT: 16 U/L (ref 0–44)
AST: 31 U/L (ref 15–41)
Albumin: 4.7 g/dL (ref 3.5–5.0)
Alkaline Phosphatase: 37 U/L — ABNORMAL LOW (ref 38–126)
Anion gap: 25 — ABNORMAL HIGH (ref 5–15)
BUN: 15 mg/dL (ref 6–20)
CO2: 8 mmol/L — ABNORMAL LOW (ref 22–32)
Calcium: 9.3 mg/dL (ref 8.9–10.3)
Chloride: 105 mmol/L (ref 98–111)
Creatinine, Ser: 1.59 mg/dL — ABNORMAL HIGH (ref 0.61–1.24)
GFR, Estimated: 54 mL/min — ABNORMAL LOW (ref >60)
Glucose, Bld: 189 mg/dL — ABNORMAL HIGH (ref 70–99)
Potassium: 4.1 mmol/L (ref 3.5–5.1)
Sodium: 138 mmol/L (ref 135–145)
Total Bilirubin: 1.1 mg/dL (ref 0.3–1.2)
Total Protein: 7.2 g/dL (ref 6.5–8.1)

## 2022-09-07 LAB — CBC WITH DIFFERENTIAL/PLATELET
Abs Immature Granulocytes: 0.12 10*3/uL — ABNORMAL HIGH (ref 0.00–0.07)
Basophils Absolute: 0.1 10*3/uL (ref 0.0–0.1)
Basophils Relative: 1 %
Eosinophils Absolute: 0.1 10*3/uL (ref 0.0–0.5)
Eosinophils Relative: 1 %
HCT: 48.8 % (ref 39.0–52.0)
Hemoglobin: 15.5 g/dL (ref 13.0–17.0)
Immature Granulocytes: 2 %
Lymphocytes Relative: 40 %
Lymphs Abs: 3 10*3/uL (ref 0.7–4.0)
MCH: 31.3 pg (ref 26.0–34.0)
MCHC: 31.8 g/dL (ref 30.0–36.0)
MCV: 98.4 fL (ref 80.0–100.0)
Monocytes Absolute: 0.6 10*3/uL (ref 0.1–1.0)
Monocytes Relative: 7 %
Neutro Abs: 3.7 10*3/uL (ref 1.7–7.7)
Neutrophils Relative %: 49 %
Platelets: 193 10*3/uL (ref 150–400)
RBC: 4.96 MIL/uL (ref 4.22–5.81)
RDW: 12.6 % (ref 11.5–15.5)
WBC: 7.6 10*3/uL (ref 4.0–10.5)
nRBC: 0 % (ref 0.0–0.2)

## 2022-09-07 LAB — CBG MONITORING, ED: Glucose-Capillary: 148 mg/dL — ABNORMAL HIGH (ref 70–99)

## 2022-09-07 LAB — TROPONIN I (HIGH SENSITIVITY)
Troponin I (High Sensitivity): 4 ng/L (ref <18)
Troponin I (High Sensitivity): 14 ng/L (ref <18)

## 2022-09-07 LAB — AMMONIA: Ammonia: 26 umol/L (ref 9–35)

## 2022-09-07 LAB — RAPID URINE DRUG SCREEN, HOSP PERFORMED
Amphetamines: NOT DETECTED
Barbiturates: NOT DETECTED
Benzodiazepines: NOT DETECTED
Cocaine: NOT DETECTED
Opiates: NOT DETECTED
Tetrahydrocannabinol: POSITIVE — AB

## 2022-09-07 LAB — I-STAT CG4 LACTIC ACID, ED
Lactic Acid, Venous: 14 mmol/L (ref 0.5–1.9)
Lactic Acid, Venous: 1.4 mmol/L (ref 0.5–1.9)

## 2022-09-07 LAB — ETHANOL: Alcohol, Ethyl (B): <10 mg/dL (ref <10)

## 2022-09-07 LAB — TSH: TSH: 5.184 u[IU]/mL — ABNORMAL HIGH (ref 0.350–4.500)

## 2022-09-07 LAB — ACETAMINOPHEN LEVEL: Acetaminophen (Tylenol), Serum: <10 ug/mL — ABNORMAL LOW (ref 10–30)

## 2022-09-07 MED ORDER — IOHEXOL 350 MG/ML SOLN
75.0000 mL | Freq: Once | INTRAVENOUS | Status: AC | PRN
  Administered 2022-09-07: 75 mL via INTRAVENOUS

## 2022-09-07 MED ORDER — LACTATED RINGERS IV BOLUS
1000.0000 mL | Freq: Once | INTRAVENOUS | Status: AC
  Administered 2022-09-07: 1000 mL via INTRAVENOUS

## 2022-09-07 MED ORDER — SODIUM CHLORIDE 0.9 % IV BOLUS
1000.0000 mL | Freq: Once | INTRAVENOUS | Status: AC
  Administered 2022-09-07: 1000 mL via INTRAVENOUS

## 2022-09-07 MED ORDER — LORAZEPAM 2 MG/ML IJ SOLN
INTRAMUSCULAR | Status: AC
  Filled 2022-09-07: qty 1

## 2022-09-07 MED ORDER — LORAZEPAM 2 MG/ML IJ SOLN
2.0000 mg | Freq: Once | INTRAMUSCULAR | Status: AC
  Administered 2022-09-07: 1 mg via INTRAVENOUS

## 2022-09-07 MED ORDER — ONDANSETRON HCL 4 MG/2ML IJ SOLN
4.0000 mg | Freq: Once | INTRAMUSCULAR | Status: AC
  Administered 2022-09-07: 4 mg via INTRAVENOUS
  Filled 2022-09-07: qty 2

## 2022-09-07 MED ORDER — SODIUM CHLORIDE 0.9 % IV SOLN
2000.0000 mg | Freq: Once | INTRAVENOUS | Status: AC
  Administered 2022-09-07: 2000 mg via INTRAVENOUS
  Filled 2022-09-07: qty 20

## 2022-09-07 NOTE — ED Notes (Addendum)
Help get patient into a gown on the monitor did EKG shown to Dr Tegeler patient is resting with nurses at bedside

## 2022-09-07 NOTE — ED Provider Notes (Signed)
Patient care assumed from previous provider.   Patient care of Paul [facility] is a 47 y.o. male from previous provider. Please see the original provider note from this emergency department encounter for full history and physical.   Course of Care and my assessment at the time of sign out is detailed in the ED Course below.   Clinical Course as of 09/07/22 2148  Thu Sep 07, 2022           1459 Found down at home, agonal respiration per EMS, bagged but no compressions, did not respond to narcan w/ EMS. Agitated. ? Seizures.  [BB]  1731 Spoke w/ neuro, recommended routine EEG and follow up outpatient [BB]  2030 C-collar cleared clinically at bedside. [BB]    Clinical Course User Index [BB] Fayrene Helper, MD [LA] Cristopher Peru, PA-C     Labs reviewed by myself and considered in medical decision making.  Imaging reviewed by myself and considered in medical decision making. Imaging final read interpreted by radiology.  1. Seizure-like activity (HCC)      I discussed the plan for discharge with the patient and/or their surrogate at bedside prior to discharge and they were in agreement with the plan and verbalized understanding of the return precautions provided. All questions answered to the best of my ability. Ultimately, the patient was discharged in stable condition with stable vital signs. I am reassured that they are capable of close follow up and good social support at home.   Disposition: Discharge  The plan for this patient was discussed with Dr. Jacqulyn Bath, who voiced agreement and who oversaw evaluation and treatment of this patient.     Fayrene Helper, MD 09/07/22 2149    Maia Plan, MD 09/11/22 [phone]

## 2022-09-07 NOTE — Discharge Instructions (Addendum)
You were seen today for a workup in the emergency department that included labs, imaging, medications, and consultation with a specialist.  These were all ultimately reassuring that no further testing or intervention is necessary in the emergency department at this time.  As we discussed, I suspect that you may have had a seizure.  The other possibility is that you passed out, or had syncope.  Either way we have scheduled a referral for follow-up with neurology.  Please call them in 2 days to confirm your follow-up.  Additionally, you need to follow-up with your primary care doctor as soon as you are able.  You should not drive a car for 6 months.  Come back to the emergency department if you have any new or worsening concerns including any changes in your thinking or awareness, tingling or numbness in any extremity, any other falls or feeling like you are going to pass out, or any other reason.

## 2022-09-07 NOTE — Progress Notes (Signed)
Pt working outside in yard and collapsed. Chaplain provided emotional and spiritual support. Chaplain will follow as needed.  Venida Jarvis, [place], [facility], Pager [phone]

## 2022-09-07 NOTE — ED Triage Notes (Signed)
Pt bib [facility] from home where he was working outside this am and was home for about an hour then said that he needed to go to the bathroom. Pt was found in the bathroom with agonal breathing, compressions started by wife. Pt had a pulse on EMS arrival but was not breathing on his own. Pt started to be bagged and came around. Pt in and out of consciousness with EMS. Pt arrives to ED combative and confused.  1mg  Narcan IN and 1mg  Narcan IV given en route with no change

## 2022-09-07 NOTE — ED Provider Notes (Signed)
[place] [facility]GENC[facility] DEPARTMENT AT [facility] Provider Note   CSN: [id number] Arrival date & time: 09/07/22  1358     History  Chief Complaint  Patient presents with   Altered Mental Status    Paul Maxwell is a 47 y.o. male.  Level 5 Caveat: AMS   Per EMS patient found down at home in bathroom. O2 sats 70s on arrival, agonal breaths. He was bagged initially, nasal trumpet inserted and then placed on NRB. Intermittently taking adequate breaths on his own. Attempted narcan with no significant improvement.   HPI     Home Medications Prior to Admission medications   Not on File      Allergies    Patient has no known allergies.    Review of Systems   Review of Systems  Unable to perform ROS: Mental status change    Physical Exam Updated Vital Signs BP 110/65   Pulse 90   Temp (!) 96.8 F (36 C) (Tympanic)   Resp 20   SpO2 100%  Physical Exam Constitutional:      General: He is in acute distress.     Appearance: He is ill-appearing and toxic-appearing.  HENT:     Head: Normocephalic.     Mouth/Throat:     Mouth: Mucous membranes are dry.     Pharynx: Oropharynx is clear.  Eyes:     Pupils: Pupils are equal, round, and reactive to light.  Cardiovascular:     Rate and Rhythm: Normal rate.     Pulses: Normal pulses.     Heart sounds: No murmur heard. Pulmonary:     Effort: Pulmonary effort is normal. No respiratory distress.     Breath sounds: Normal breath sounds.  Abdominal:     Palpations: Abdomen is soft.     Tenderness: There is no abdominal tenderness.  Musculoskeletal:        General: No deformity. Normal range of motion.     Cervical back: Neck supple.  Skin:    General: Skin is warm and dry.     Capillary Refill: Capillary refill takes less than 2 seconds.  Neurological:     Mental Status: He is unresponsive.     GCS: GCS eye subscore is 1. GCS verbal subscore is 2. GCS motor subscore is 5.     Motor: Motor function is intact.      ED Results / Procedures / Treatments   Labs (all labs ordered are listed, but only abnormal results are displayed) Labs Reviewed  ACETAMINOPHEN LEVEL - Abnormal; Notable for the following components:      Result Value   Acetaminophen (Tylenol), Serum <10 (*)    All other components within normal limits  CBC WITH DIFFERENTIAL/PLATELET - Abnormal; Notable for the following components:   Abs Immature Granulocytes 0.12 (*)    All other components within normal limits  CBG MONITORING, ED - Abnormal; Notable for the following components:   Glucose-Capillary 148 (*)    All other components within normal limits  I-STAT CG4 LACTIC ACID, ED - Abnormal; Notable for the following components:   Lactic Acid, Venous 14.0 (*)    All other components within normal limits  I-STAT CHEM 8, ED - Abnormal; Notable for the following components:   Creatinine, Ser 1.40 (*)    Glucose, Bld 183 (*)    Calcium, Ion 1.08 (*)    TCO2 14 (*)    All other components within normal limits  I-STAT VENOUS BLOOD GAS, ED -  Abnormal; Notable for the following components:   pH, Ven 7.154 (*)    pCO2, Ven 32.1 (*)    pO2, Ven 92 (*)    Bicarbonate 11.3 (*)    TCO2 12 (*)    Acid-base deficit 16.0 (*)    Calcium, Ion 1.08 (*)    All other components within normal limits  ETHANOL  COMPREHENSIVE METABOLIC PANEL  URINALYSIS, ROUTINE W REFLEX MICROSCOPIC  RAPID URINE DRUG SCREEN, HOSP PERFORMED  AMMONIA  TSH  TROPONIN I (HIGH SENSITIVITY)    EKG EKG Interpretation Date/Time:  Thursday September 07 2022 14:00:04 EDT Ventricular Rate:  90 PR Interval:  154 QRS Duration:  120 QT Interval:  385 QTC Calculation: 472 R Axis:   76  Text Interpretation: Sinus rhythm Consider left atrial enlargement Nonspecific intraventricular conduction delay no prior ECG for comparison. No STEMI Confirmed by Theda Belfast ([id number]) on 09/07/2022 2:01:20 PM  Radiology CT ABDOMEN PELVIS W CONTRAST  Result Date:  09/07/2022 CLINICAL DATA:  Collapsed. EXAM: CT ABDOMEN AND PELVIS WITH CONTRAST TECHNIQUE: Multidetector CT imaging of the abdomen and pelvis was performed using the standard protocol following bolus administration of intravenous contrast. RADIATION DOSE REDUCTION: This exam was performed according to the departmental dose-optimization program which includes automated exposure control, adjustment of the mA and/or kV according to patient size and/or use of iterative reconstruction technique. CONTRAST:  75mL OMNIPAQUE IOHEXOL 350 MG/ML SOLN COMPARISON:  CT abdomen and pelvis with contrast 03/11/2021 FINDINGS: Lower chest: Mild dependent atelectasis is present bilaterally. The bases are otherwise clear. The heart size is normal. No significant pleural or pericardial effusion present. Hepatobiliary: No focal liver abnormality is seen. No gallstones, gallbladder wall thickening, or biliary dilatation. Pancreas: Unremarkable. No pancreatic ductal dilatation or surrounding inflammatory changes. Spleen: Normal in size without focal abnormality. Adrenals/Urinary Tract: Adrenal glands are unremarkable. Kidneys are normal, without renal calculi, focal lesion, or hydronephrosis. Bladder is unremarkable. Stomach/Bowel: The stomach and duodenum are within normal limits. Small bowel is unremarkable. The terminal ileum is normal. The appendix is not discretely visualized and may be surgically absent. The ascending and transverse colon are within normal limits. The descending and sigmoid colon are normal. Rectum is moderately enlarged, measuring 6.6 cm in transverse diameter and filled with stool. No proximal obstruction is evident. Vascular/Lymphatic: No significant vascular findings are present. No enlarged abdominal or pelvic lymph nodes. Reproductive: Prostate is unremarkable. Other: No abdominal wall hernia or abnormality. No abdominopelvic ascites. Musculoskeletal: No acute or significant osseous findings. IMPRESSION: 1.  Moderately enlarged rectum filled with stool. No proximal obstruction is evident. 2. No other acute or focal lesion to explain the patient's symptoms. Electronically Signed   By: Marin Roberts M.D.   On: 09/07/2022 14:56   CT Head Wo Contrast  Result Date: 09/07/2022 CLINICAL DATA:  Mental status change, unknown cause EXAM: CT HEAD WITHOUT CONTRAST TECHNIQUE: Contiguous axial images were obtained from the base of the skull through the vertex without intravenous contrast. RADIATION DOSE REDUCTION: This exam was performed according to the departmental dose-optimization program which includes automated exposure control, adjustment of the mA and/or kV according to patient size and/or use of iterative reconstruction technique. COMPARISON:  CT Head 07/24/19 FINDINGS: Brain: No evidence of acute infarction, hemorrhage, hydrocephalus, extra-axial collection or mass lesion/mass effect. Sequela of mild chronic microvascular ischemic change. Vascular: No hyperdense vessel or unexpected calcification. Skull: Chronic bilateral nasal bone fractures. Sinuses/Orbits: No middle ear or mastoid effusion. Paranasal sinuses are clear. Orbits are unremarkable. Other: None. IMPRESSION: No  acute intracranial abnormality. Electronically Signed   By: Lorenza Cambridge M.D.   On: 09/07/2022 14:56   CT Angio Chest PE W/Cm &/Or Wo Cm  Result Date: 09/07/2022 CLINICAL DATA:  Pulmonary embolism suspected, high probability. Patient working outside in the yard and collapsed. EXAM: CT ANGIOGRAPHY CHEST WITH CONTRAST TECHNIQUE: Multidetector CT imaging of the chest was performed using the standard protocol during bolus administration of intravenous contrast. Multiplanar CT image reconstructions and MIPs were obtained to evaluate the vascular anatomy. RADIATION DOSE REDUCTION: This exam was performed according to the departmental dose-optimization program which includes automated exposure control, adjustment of the mA and/or kV according to  patient size and/or use of iterative reconstruction technique. CONTRAST:  75mL OMNIPAQUE IOHEXOL 350 MG/ML SOLN COMPARISON:  None Available. FINDINGS: Cardiovascular: The heart size is normal. The aortic arch great vessel origins are normal. Pulmonary artery opacification is satisfactory. No focal filling defects are present to suggest pulmonary embolus. Pulmonary artery size is normal. Mediastinum/Nodes: No enlarged mediastinal, hilar, or axillary lymph nodes. Thyroid gland, trachea, and esophagus demonstrate no significant findings. Lungs/Pleura: Mild dependent atelectasis is present both lungs. The lungs are otherwise clear. No significant pleural effusion or pneumothorax is present. Upper Abdomen: Limited imaging the abdomen is unremarkable. There is no significant adenopathy. No solid organ lesions are present. Musculoskeletal: No chest wall abnormality. No acute or significant osseous findings. Review of the MIP images confirms the above findings. IMPRESSION: 1. No pulmonary embolus. 2. Normal CT angiogram of the chest. Electronically Signed   By: Marin Roberts M.D.   On: 09/07/2022 14:53   DG Pelvis Portable  Result Date: 09/07/2022 CLINICAL DATA:  Found down. EXAM: PORTABLE PELVIS 1-2 VIEWS COMPARISON:  CT abdomen pelvis dated March 11, 2021. FINDINGS: There is no evidence of pelvic fracture or diastasis. No pelvic bone lesions are seen. IMPRESSION: Negative. Electronically Signed   By: Obie Dredge M.D.   On: 09/07/2022 14:37   DG Chest Port 1 View  Result Date: 09/07/2022 CLINICAL DATA:  Found down. EXAM: PORTABLE CHEST 1 VIEW COMPARISON:  None Available. FINDINGS: The heart size and mediastinal contours are within normal limits. Both lungs are clear. The visualized skeletal structures are unremarkable. IMPRESSION: No active disease. Electronically Signed   By: Obie Dredge M.D.   On: 09/07/2022 14:36    Procedures Procedures   Medications Ordered in ED Medications  sodium  chloride 0.9 % bolus 1,000 mL (0 mLs Intravenous Stopped 09/07/22 1458)  LORazepam (ATIVAN) injection 2 mg (1 mg Intravenous Given 09/07/22 1416)  iohexol (OMNIPAQUE) 350 MG/ML injection 75 mL (75 mLs Intravenous Contrast Given 09/07/22 1437)    ED Course/ Medical Decision Making/ A&P Clinical Course as of 09/07/22 1509  Thu Sep 07, 2022  1413 Awake and answering more questions appropriate. Removed nasal trumpet. Continues to be somewhat agitated and uncooperative. Denies headache or any pain, shortness of breath, etc.  [LA]  1422 Attempted to call wife without answer  [LA]  1432 Wife at bedside. I have updated her  [LA]  1459 Found down at home, agonal respiration per EMS, bagged but no compressions, did not respond to narcan w/ EMS. Agitated. ? Seizures.  [BB]    Clinical Course User Index [BB] Fayrene Helper, MD [LA] Cristopher Peru, PA-C    Medical Decision Making Amount and/or Complexity of Data Reviewed Labs: ordered. Radiology: ordered.  Risk Prescription drug management.  Initial Impression and Ddx 47 year old male presents altered, with intermittent agonal breathing. Wife heard him collapse at  home.  Patient PMH that increases complexity of ED encounter:  unknown  Interpretation of Diagnostics I independent reviewed and interpreted the labs as followed: lactic 14, acidotic   - I independently visualized the following imaging with scope of interpretation limited to determining acute life threatening conditions related to emergency care: CT head, CTA PE, CT abdomen pelvis, which revealed no acute findings  Patient Reassessment and Ultimate Disposition/Management 47 year old male found down at home with agonal breaths. Bagged. Arrives to ED with NRB, shallow breaths but maintaining airway. Agitated.   Shortly after arrival, waking up more spontaneously, making eye contact. Somewhat inappropriate affect. Confused. Restless. Given ativan. Will pan scan. Labs including lactic,  troponin, ammonia, UDS, venous gas.   Wife arrived @ 62. Was able to speak with her. She states he came home for lunch around 1 which is normal for him. [place] outside for the city. States that he went into the bathroom and then she heard a loud groan and him collapse. She had to break the lock on the door to get in and he was on the ground, purple and foaming at the mouth. States he was making gargling sounds.    Lactic 14 Imaging negative Mildly acidotic.   ?new onset seizure vs syncope vs toxidrome.   Care of patient being handed off to Dr. Willeen Cass, PGY-2. Anticipate admission for further workup, although if patient completely turns around to baseline, lactic clearing, could be possibly discharged. Please see his note for ongoing care, ultimate dispo.  Final Clinical Impression(s) / ED Diagnoses Final diagnoses:  None    Rx / DC Orders ED Discharge Orders     None         Cristopher Peru, PA-C 09/07/22 1511    Tegeler, Canary Brim, MD 09/07/22 1517

## 2022-09-13 ENCOUNTER — Encounter: Payer: Self-pay | Admitting: Family Medicine

## 2022-09-13 ENCOUNTER — Ambulatory Visit (INDEPENDENT_AMBULATORY_CARE_PROVIDER_SITE_OTHER): Payer: BC Managed Care – PPO | Admitting: Family Medicine

## 2022-09-13 VITALS — BP 110/68 | HR 59 | Temp 97.8°F | Resp 16 | Wt 154.6 lb

## 2022-09-13 DIAGNOSIS — T671XXA Heat syncope, initial encounter: Secondary | ICD-10-CM

## 2022-09-13 DIAGNOSIS — E860 Dehydration: Secondary | ICD-10-CM | POA: Diagnosis not present

## 2022-09-13 LAB — POCT URINALYSIS DIP (CLINITEK)
Bilirubin, UA: NEGATIVE
Blood, UA: NEGATIVE
Glucose, UA: NEGATIVE mg/dL
Ketones, POC UA: NEGATIVE mg/dL
Leukocytes, UA: NEGATIVE
Nitrite, UA: NEGATIVE
POC PROTEIN,UA: NEGATIVE
Spec Grav, UA: 1.01 (ref 1.010–1.025)
Urobilinogen, UA: 0.2 E.U./dL
pH, UA: 6.5 (ref 5.0–8.0)

## 2022-09-13 LAB — CBC WITH DIFFERENTIAL/PLATELET
Basos: 1 %
Eos: 2 %
Hematocrit: 47.6 % (ref 37.5–51.0)
Hemoglobin: 16.1 g/dL (ref 13.0–17.7)
Immature Grans (Abs): 0 10*3/uL (ref 0.0–0.1)
Immature Granulocytes: 0 %
Lymphocytes Absolute: 1.1 10*3/uL (ref 0.7–3.1)
MCH: 30.7 pg (ref 26.6–33.0)
MCHC: 33.8 g/dL (ref 31.5–35.7)
MCV: 91 fL (ref 79–97)
Neutrophils Absolute: 3.4 10*3/uL (ref 1.4–7.0)
Neutrophils: 67 %
Platelets: 210 10*3/uL (ref 150–450)
RDW: 12.5 % (ref 11.6–15.4)

## 2022-09-13 LAB — COMPREHENSIVE METABOLIC PANEL

## 2022-09-13 NOTE — Progress Notes (Signed)
   Subjective:    Patient ID: Paul Maxwell, male    DOB: September 10, 1975, 47 y.o.   MRN: [id number]  HPI Is here for follow-up visit after recent emergency room visit on July 18.  He was admitted due to altered mental status.  A more thorough history indicates that he had been working outside in his heat for a long time.  He admits to not hydrating himself well.  On the day prior he was again outside and on the day that he had his episode, he came home from work after the day before being in the attic putting in a fan.  Apparently he did not remember coming home for lunch.  He then had an episode where he did become hypoxic and did become cyanotic with a pulse ox of 70.  Review of the emergency room record showed that he dehydrated with a specific gravity quite high, he also had evidence of lactic acidosis as well as increased creatinine.  Presently he seems to be doing a lot better and does have an appointment with neurology.   Review of Systems     Objective:    Physical Exam Alert and in no distress. Tympanic membranes and canals are normal. Pharyngeal area is normal. Neck is supple without adenopathy or thyromegaly. Cardiac exam shows a regular sinus rhythm without murmurs or gallops. Lungs are clear to auscultation.        Assessment & Plan:   Problem List Items Addressed This Visit   None Visit Diagnoses     Heat syncope, initial encounter    -  Primary   Relevant Orders   CBC with Differential/Platelet   Comprehensive metabolic panel   Dehydration       Relevant Orders   POCT URINALYSIS DIP (CLINITEK)     I think that his symptoms revolve around the fact that he did not keep himself hydrated and physiologically put himself at risk for having a syncopal episode that could have been a seizure.  He is scheduled to have neurology evaluate this.  Explained that unless he has evidence of an abnormal EEG comfortable with him having symptoms at all revolve around being dehydrated and  the physiologic response from his body to that.

## 2022-09-14 LAB — COMPREHENSIVE METABOLIC PANEL
ALT: 20 IU/L (ref 0–44)
AST: 33 IU/L (ref 0–40)
Albumin: 4.8 g/dL (ref 4.1–5.1)
Alkaline Phosphatase: 48 IU/L (ref 44–121)
BUN/Creatinine Ratio: 11 (ref 9–20)
CO2: 25 mmol/L (ref 20–29)
Creatinine, Ser: 1.08 mg/dL (ref 0.76–1.27)
Potassium: 4.5 mmol/L (ref 3.5–5.2)
Sodium: 141 mmol/L (ref 134–144)
Total Protein: 7.3 g/dL (ref 6.0–8.5)
eGFR: 86 mL/min/{1.73_m2} (ref >59)

## 2022-09-14 LAB — CBC WITH DIFFERENTIAL/PLATELET
Basophils Absolute: 0 10*3/uL (ref 0.0–0.2)
EOS (ABSOLUTE): 0.1 10*3/uL (ref 0.0–0.4)
Lymphs: 21 %
Monocytes Absolute: 0.5 10*3/uL (ref 0.1–0.9)
Monocytes: 9 %
RBC: 5.25 x10E6/uL (ref 4.14–5.80)
WBC: 5.1 10*3/uL (ref 3.4–10.8)

## 2022-09-20 ENCOUNTER — Ambulatory Visit (INDEPENDENT_AMBULATORY_CARE_PROVIDER_SITE_OTHER): Payer: BC Managed Care – PPO | Admitting: Neurology

## 2022-09-20 ENCOUNTER — Encounter: Payer: Self-pay | Admitting: Neurology

## 2022-09-20 VITALS — BP 109/67 | HR 61 | Ht 71.0 in | Wt 154.0 lb

## 2022-09-20 DIAGNOSIS — R569 Unspecified convulsions: Secondary | ICD-10-CM

## 2022-09-20 MED ORDER — VALTOCO 15 MG DOSE 7.5 MG/0.1ML NA LQPK: 15.0000 mg | NASAL | 0 refills | Status: DC | PRN

## 2022-09-20 NOTE — Patient Instructions (Addendum)
Routine EEG, I will contact you to go over the results  Valtoco as rescue medication  Continue to follow up with PCP  Return in 6 months or sooner if worse

## 2022-09-20 NOTE — Progress Notes (Signed)
[facility]  PATIENT: Paul Maxwell DOB: [birth year]  REQUESTING CLINICIAN: Long, Arlyss Repress, MD HISTORY FROM: Patient/Spouse and chart review  REASON FOR VISIT: Seizure like activity    HISTORICAL  CHIEF COMPLAINT:  Chief Complaint  Patient presents with   Hospitalization Follow-up    Rm12, wife present (dawn) Sz-like activity: nonen after hospital, since hospital stay he says he doing okay just sore in shoulders and upper back     HISTORY OF PRESENT ILLNESS:  This is a 47 year old man with no reported past medical history who is presenting after likely a seizure episode. Patient reports that is memory is not clear surrounding the events and he cannot remember the hospital stay and 2 days after leaving the hospital. Most of the history obtained from wife. Wife reports that patient has been working outside the days prior, he was working in the attic where it was very hot the day prior. The next morning he went to work, then came home for lunch. At that time, he reported that he was not feeling well. He went to the bathroom, and wife heard a loud noise, like a moaning sound, then he fell against the door.  She had to break the door to get in and found patient laying down, no convulsion at that time but he was purple in color, foaming at the mouth, no blood noted and having deep breathing like deep snoring.  She was not sure of any urinary incontinence.Marland Kitchen  EMS was called and patient was taken to the ED.  Per report, EMS have to administer patient a medication due to patient being combative.  In the ED his head CT was negative for any acute abnormality.  His lactic acid was elevated at 14, his urine for positive was for [facility] and his specific gravity was elevated at 1.046.  Since being back home, he reported the first 2 days was left ear but now is back to his normal self.  He does have some muscle muscular soreness but otherwise doing well.  Of note patient reports for the  past few years he has been having episodes of increased stress, difficulty breathing, possible palpitation, feel like he needs to lay down.  Again these episodes have been worsening in  frequency.  He tells me that is very difficult to describe them but it feel like is related to stress at work and at home.     Handedness: Right handed   Onset: July 18  Seizure Type:  Fall, unresponsiveness,   Current frequency: Only once   Any injuries from seizures: Denies  Seizure risk factors: Denies   Previous ASMs: None   Currenty ASMs: None   ASMs side effects: N/A   Brain Images: Normal Head CT   Previous EEGs: Not previously done    OTHER MEDICAL CONDITIONS: Denies other medication conditions   REVIEW OF SYSTEMS: Full 14 system review of systems performed and negative with exception of: As noted in the HPI   ALLERGIES: No Known Allergies  HOME MEDICATIONS: No outpatient medications prior to visit.   No facility-administered medications prior to visit.    PAST MEDICAL HISTORY: Past Medical History:  Diagnosis Date   Seizures (HCC)     PAST SURGICAL HISTORY: History reviewed. No pertinent surgical history.  FAMILY HISTORY: Family History  Problem Relation Age of Onset   Cancer Mother        breast   Heart disease Father 47  pacer    SOCIAL HISTORY: Social History   Socioeconomic History   Marital status: Married    Spouse name: dawn   Number of children: 2   Years of education: Not on file   Highest education level: Bachelor's degree (e.g., BA, AB, BS)  Occupational History   Not on file  Tobacco Use   Smoking status: Never   Smokeless tobacco: Never  Vaping Use   Vaping status: Never Used  Substance and Sexual Activity   Alcohol use: Yes    Alcohol/week: 7.0 standard drinks of alcohol    Types: 7 Cans of beer per week   Drug use: No   Sexual activity: Yes    Birth control/protection: None  Other Topics Concern   Not on file  Social  History Narrative   Not on file   Social Determinants of Health   Financial Resource Strain: Not on file  Food Insecurity: Not on file  Transportation Needs: Not on file  Physical Activity: Not on file  Stress: Not on file  Social Connections: Not on file  Intimate Partner Violence: Not on file    PHYSICAL EXAM  GENERAL EXAM/CONSTITUTIONAL: Vitals:  Vitals:   09/20/22 0954  BP: 109/67  Pulse: 61  Weight: 154 lb (69.9 kg)  Height: 5\' 11"  (1.803 m)   Body mass index is 21.48 kg/m. Wt Readings from Last 3 Encounters:  09/20/22 154 lb (69.9 kg)  09/13/22 154 lb 9.6 oz (70.1 kg)  02/07/22 154 lb 12.8 oz (70.2 kg)   Patient is in no distress; well developed, nourished and groomed; neck is supple  MUSCULOSKELETAL: Gait, strength, tone, movements noted in Neurologic exam below  NEUROLOGIC: MENTAL STATUS:      No data to display         awake, alert, oriented to person, place and time recent and remote memory intact normal attention and concentration language fluent, comprehension intact, naming intact fund of knowledge appropriate  CRANIAL NERVE:  2nd, 3rd, 4th, 6th - Visual fields full to confrontation, extraocular muscles intact, no nystagmus 5th - facial sensation symmetric 7th - facial strength symmetric 8th - hearing intact 9th - palate elevates symmetrically, uvula midline 11th - shoulder shrug symmetric 12th - tongue protrusion midline  MOTOR:  normal bulk and tone, full strength in the BUE, BLE  SENSORY:  normal and symmetric to light touch  COORDINATION:  finger-nose-finger, fine finger movements normal  GAIT/STATION:  normal     DIAGNOSTIC DATA (LABS, IMAGING, TESTING) - I reviewed patient records, labs, notes, testing and imaging myself where available.  Lab Results  Component Value Date   WBC 5.1 09/13/2022   HGB 16.1 09/13/2022   HCT 47.6 09/13/2022   MCV 91 09/13/2022   PLT 210 09/13/2022      Component Value Date/Time   NA  141 09/13/2022 1141   K 4.5 09/13/2022 1141   CL 101 09/13/2022 1141   CO2 25 09/13/2022 1141   GLUCOSE 85 09/13/2022 1141   GLUCOSE 183 (H) 09/07/2022 1419   BUN 12 09/13/2022 1141   CREATININE 1.08 09/13/2022 1141   CALCIUM 9.7 09/13/2022 1141   PROT 7.3 09/13/2022 1141   ALBUMIN 4.8 09/13/2022 1141   AST 33 09/13/2022 1141   ALT 20 09/13/2022 1141   ALKPHOS 48 09/13/2022 1141   BILITOT 0.6 09/13/2022 1141   GFRNONAA 54 (L) 09/07/2022 1400   GFRAA 92 05/07/2017 1034   Lab Results  Component Value Date   CHOL 234 (H) 02/07/2022  HDL 69 02/07/2022   LDLCALC 153 (H) 02/07/2022   TRIG 70 02/07/2022   No results found for: "HGBA1C" No results found for: "VITAMINB12" Lab Results  Component Value Date   TSH 5.184 (H) 09/07/2022    Head CT 09/07/2022 No acute intracranial abnormality.    ASSESSMENT AND PLAN  47 y.o. year old male  with no reported past medical history who is presenting with an event concerning for seizures.  Plan for now is to obtain a routine EEG. If abnormal, I will bring the patient back to discuss medication.  If normal then we will continue to observe him but he understands to contact me if he does have a another seizure.  I will also give him a rescue medication in the case that he have another seizure. Continue to follow with PCP return in 6 months or sooner if worse    1. Seizures (HCC)     Patient Instructions  Routine EEG, I will contact you to go over the results  Valtoco as rescue medication  Continue to follow up with PCP  Return in 6 months or sooner if worse    Per [facility] statutes, patients with seizures are not allowed to drive until they have been seizure-free for six months.  Other recommendations include using caution when using heavy equipment or power tools. Avoid working on ladders or at heights. Take showers instead of baths.  Do not swim alone.  Ensure the water temperature is not too high on the home water heater. Do  not go swimming alone. Do not lock yourself in a room alone (i.e. bathroom). When caring for infants or small children, sit down when holding, feeding, or changing them to minimize risk of injury to the child in the event you have a seizure. Maintain good sleep hygiene. Avoid alcohol.  Also recommend adequate sleep, hydration, good diet and minimize stress.   During the Seizure  - First, ensure adequate ventilation and place patients on the floor on their left side  Loosen clothing around the neck and ensure the airway is patent. If the patient is clenching the teeth, do not force the mouth open with any object as this can cause severe damage - Remove all items from the surrounding that can be hazardous. The patient may be oblivious to what's happening and may not even know what he or she is doing. If the patient is confused and wandering, either gently guide him/her away and block access to outside areas - Reassure the individual and be comforting - Call 911. In most cases, the seizure ends before EMS arrives. However, there are cases when seizures may last over 3 to 5 minutes. Or the individual may have developed breathing difficulties or severe injuries. If a pregnant patient or a person with diabetes develops a seizure, it is prudent to call an ambulance. - Finally, if the patient does not regain full consciousness, then call EMS. Most patients will remain confused for about 45 to 90 minutes after a seizure, so you must use judgment in calling for help. - Avoid restraints but make sure the patient is in a bed with padded side rails - Place the individual in a lateral position with the neck slightly flexed; this will help the saliva drain from the mouth and prevent the tongue from falling backward - Remove all nearby furniture and other hazards from the area - Provide verbal assurance as the individual is regaining consciousness - Provide the patient with privacy if  possible - Call for help and  start treatment as ordered by the caregiver   After the Seizure (Postictal Stage)  After a seizure, most patients experience confusion, fatigue, muscle pain and/or a headache. Thus, one should permit the individual to sleep. For the next few days, reassurance is essential. Being calm and helping reorient the person is also of importance.  Most seizures are painless and end spontaneously. Seizures are not harmful to others but can lead to complications such as stress on the lungs, brain and the heart. Individuals with prior lung problems may develop labored breathing and respiratory distress.     Orders Placed This Encounter  Procedures   EEG adult    Meds ordered this encounter  Medications   diazePAM, 15 MG Dose, (VALTOCO 15 MG DOSE) 2 x 7.5 MG/0.1ML LQPK    Sig: Place 15 mg into the nose as needed (For seizure lasting more than 2 minutes).    Dispense:  2 each    Refill:  0    Return in about 6 months (around 03/23/2023).    Windell Norfolk, MD 09/20/2022, 6:09 PM  [facility] [address] [place], [place] [address] [phone]

## 2022-09-25 ENCOUNTER — Ambulatory Visit (INDEPENDENT_AMBULATORY_CARE_PROVIDER_SITE_OTHER): Payer: BC Managed Care – PPO | Admitting: Neurology

## 2022-09-25 DIAGNOSIS — R569 Unspecified convulsions: Secondary | ICD-10-CM

## 2022-09-25 NOTE — Procedures (Signed)
    History:  47 year old man with seizure   EEG classification: Awake and drowsy  Technical aspects: This EEG study was done with scalp electrodes positioned according to the 10-20 International system of electrode placement. Electrical activity was reviewed with band pass filter of 1-70Hz , sensitivity of 7 uV/mm, display speed of 35mm/sec with a 60Hz  notched filter applied as appropriate. EEG data were recorded continuously and digitally stored.   Description of the recording: The background rhythms of this recording consists of a fairly well modulated medium amplitude alpha rhythm of 9-10 Hz that is reactive to eye opening and closure. Present in the anterior head region is a 15-20 Hz beta activity. Photic stimulation was performed, and there was a diffuse high amplitude theta activity with embedded sharp and slow wave activity. Hyperventilation was also performed, did not show any abnormalities. Drowsiness was manifested by background fragmentation. There was no focal slowing. There were no electrographic seizure identified.   Abnormality: Diffuse high amplitude theta activity with embedded sharp and slow wave activity seen during photic   Impression: This is an abnormal EEG recorded while drowsy and awake due to presence of diffuse high amplitude theta activity with embedded sharp and slow wave activity seen during photic which potentially could be epileptogenic. Clinical correlation advised.    Windell Norfolk, MD [facility]

## 2022-11-06 ENCOUNTER — Encounter (HOSPITAL_COMMUNITY): Payer: Self-pay

## 2022-11-06 ENCOUNTER — Emergency Department (HOSPITAL_COMMUNITY)
Admission: EM | Admit: 2022-11-06 | Discharge: 2022-11-06 | Disposition: A | Discharge status: Home / Self Care | Payer: BC Managed Care – PPO | Attending: Emergency Medicine | Admitting: Emergency Medicine

## 2022-11-06 ENCOUNTER — Other Ambulatory Visit: Payer: Self-pay

## 2022-11-06 DIAGNOSIS — R569 Unspecified convulsions: Secondary | ICD-10-CM | POA: Insufficient documentation

## 2022-11-06 DIAGNOSIS — R404 Transient alteration of awareness: Secondary | ICD-10-CM | POA: Diagnosis not present

## 2022-11-06 DIAGNOSIS — R9431 Abnormal electrocardiogram [ECG] [EKG]: Secondary | ICD-10-CM | POA: Diagnosis not present

## 2022-11-06 DIAGNOSIS — R55 Syncope and collapse: Secondary | ICD-10-CM | POA: Diagnosis not present

## 2022-11-06 DIAGNOSIS — R410 Disorientation, unspecified: Secondary | ICD-10-CM | POA: Diagnosis not present

## 2022-11-06 MED ORDER — LEVETIRACETAM 500 MG PO TABS
500.0000 mg | ORAL_TABLET | Freq: Once | ORAL | Status: AC
  Administered 2022-11-06: 500 mg via ORAL
  Filled 2022-11-06: qty 1

## 2022-11-06 MED ORDER — LEVETIRACETAM 500 MG PO TABS: 500.0000 mg | ORAL_TABLET | Freq: Two times a day (BID) | ORAL | 0 refills | Status: DC

## 2022-11-06 NOTE — Discharge Instructions (Addendum)
Department of Motor Vehicle [facility]) of [place] regulations for seizures - It is the patient's responsibility to report the incidence of the seizure in the state of [place]. [place] has no statutory provision requiring physicians to report patients diagnosed with epilepsy or seizures to a central state agency.  The recommended DMV regulation requirement for a driver in [place] for an individual with a seizure is that they be seizure-free for 6-12 months. However, the DMV may consider the following exceptions to this general rule where: (1) a physician-directed change in medication causes a seizure and the individual immediately resumes the previous therapy which controlled seizures; (2) there is a history of nocturnal seizures or seizures which do not involve loss of consciousness, loss of control of motor function, or loss of appropriate sensation and information process; and (3) an individual has a seizure disorder preceded by an aura (warning) lasting 2-3 minutes. While the [facility] may also give consideration to other unusual circumstances which may affect the general requirement that drivers be seizure-free for 6-12 months, interpretation of these circumstances and assignment of restrictions is at the discretion of the Medical Advisor. The DMV also considers compliance with medical therapy essential for safe driving. [facility] [place] Physician's Guide to [employer] (June, 1995 ed.)] The Department learns of an individual's condition by inquiring on the application form or renewal form, a physician's report to the [facility], an accident report or from correspondence from the individual. The person may be required to submit a Medical Report Form either annually or semi-annually.

## 2022-11-06 NOTE — ED Provider Notes (Signed)
[place] EMERGENCY DEPARTMENT AT [facility] Provider Note   CSN: [id number] Arrival date & time: 11/06/22  1422     History  Chief Complaint  Patient presents with   Seizures    Paul Maxwell is a 47 y.o. male.   Seizures    47 year old male with medical history significant for 1 prior episode of seizure with EEG concerning outpatient for epileptiform activity not currently on an AED who presents to the emergency department with a seizure.  The history is provided by the patient's wife and the patient.  Patient was reportedly not feeling well earlier today, no headache or neck stiffness.  No fevers.  A viral respiratory syndrome had been going around the house.  The patient came home and was found sitting upright on the bed foaming at the mouth, unresponsive, generalized tonic-clonic shaking.  He was placed by family in the recovery position and given intranasal Versed.  Symptoms lasted for a total of 1 minute with a postictal state noted by EMS which has since resolved on arrival.  Blood glucose with EMS was notably 139.  The patient follows outpatient with [facility] neurology and has not yet been started on an AED. He currently is asymptomatic and back to his baseline.  Home Medications Prior to Admission medications   Medication Sig Start Date End Date Taking? Authorizing Provider  levETIRAcetam (KEPPRA) 500 MG tablet Take 1 tablet (500 mg total) by mouth 2 (two) times daily. 11/06/22 02/04/23 Yes Ernie Avena, MD  diazePAM, 15 MG Dose, (VALTOCO 15 MG DOSE) 2 x 7.5 MG/0.1ML LQPK Place 15 mg into the nose as needed (For seizure lasting more than 2 minutes). 09/20/22   Windell Norfolk, MD      Allergies    Patient has no known allergies.    Review of Systems   Review of Systems  Neurological:  Positive for seizures.  All other systems reviewed and are negative.   Physical Exam Updated Vital Signs BP 111/77   Pulse 71   Temp (!) 97.5 F (36.4 C) (Oral)    Resp 15   SpO2 100%  Physical Exam Vitals and nursing note reviewed.  Constitutional:      General: He is not in acute distress.    Appearance: He is well-developed.  HENT:     Head: Normocephalic and atraumatic.  Eyes:     Conjunctiva/sclera: Conjunctivae normal.  Cardiovascular:     Rate and Rhythm: Normal rate and regular rhythm.  Pulmonary:     Effort: Pulmonary effort is normal. No respiratory distress.     Breath sounds: Normal breath sounds.  Abdominal:     Palpations: Abdomen is soft.     Tenderness: There is no abdominal tenderness.  Musculoskeletal:        General: No swelling.     Cervical back: Neck supple.  Skin:    General: Skin is warm and dry.     Capillary Refill: Capillary refill takes less than 2 seconds.  Neurological:     General: No focal deficit present.     Mental Status: He is alert and oriented to person, place, and time. Mental status is at baseline.     Cranial Nerves: No cranial nerve deficit.     Sensory: No sensory deficit.     Motor: No weakness.  Psychiatric:        Mood and Affect: Mood normal.     ED Results / Procedures / Treatments   Labs (all labs ordered  are listed, but only abnormal results are displayed) Labs Reviewed  CBG MONITORING, ED    EKG EKG Interpretation Date/Time:  Monday November 06 2022 14:37:21 EDT Ventricular Rate:  76 PR Interval:  143 QRS Duration:  110 QT Interval:  398 QTC Calculation: 448 R Axis:   68  Text Interpretation: Sinus rhythm No significant change since last tracing Confirmed by Ernie Avena ([id number]) on 11/06/2022 4:11:58 PM  Radiology No results found.  Procedures Procedures    Medications Ordered in ED Medications  levETIRAcetam (KEPPRA) tablet 500 mg (has no administration in time range)    ED Course/ Medical Decision Making/ A&P                                 Medical Decision Making Risk Prescription drug management.    47 year old male with medical history significant  for 1 prior episode of seizure with EEG concerning outpatient for epileptiform activity not currently on an AED who presents to the emergency department with a seizure.  The history is provided by the patient's wife and the patient.  Patient was reportedly not feeling well earlier today, no headache or neck stiffness.  No fevers.  A viral respiratory syndrome had been going around the house.  The patient came home and was found sitting upright on the bed foaming at the mouth, unresponsive, generalized tonic-clonic shaking.  He was placed by family in the recovery position and given intranasal Versed.  Symptoms lasted for a total of 1 minute with a postictal state noted by EMS which has since resolved on arrival.  Blood glucose with EMS was notably 139.  The patient follows outpatient with [facility] neurology and has not yet been started on an AED. He currently is asymptomatic and back to his baseline.  CBG normal, initially blood pressure was low but subsequent rechecks revealed normotension.  Vitally stable on arrival, sinus rhythm noted on cardiac telemetry.  Neurologic exam at baseline and the patient has returned to his baseline mental status.  Patient given a single tablet of 500 mg of Keppra.  He was started on initial prescription of 500 mg of Keppra twice daily.  He was advised to follow-up closely with his neurologist at [facility] neurology.  Stable for discharge, driving restrictions provided in the patient's discharge paperwork.   Final Clinical Impression(s) / ED Diagnoses Final diagnoses:  Seizure [facility])    Rx / DC Orders ED Discharge Orders          Ordered    levETIRAcetam (KEPPRA) 500 MG tablet  2 times daily        11/06/22 1625              Ernie Avena, MD 11/06/22 1627

## 2022-11-06 NOTE — ED Triage Notes (Signed)
[facility] from home s/p witnessed seizure while in bed, witnessed by wife. New seizure hx (one seizure prior to today) Given valtoco IN by wife prior to arrival. Post ictal on arrival. Glucose 139.

## 2022-11-07 LAB — CBG MONITORING, ED: Glucose-Capillary: 156 mg/dL — ABNORMAL HIGH (ref 70–99)

## 2022-11-21 DIAGNOSIS — R9401 Abnormal electroencephalogram [EEG]: Secondary | ICD-10-CM | POA: Diagnosis not present

## 2022-11-21 DIAGNOSIS — R569 Unspecified convulsions: Secondary | ICD-10-CM | POA: Diagnosis not present

## 2022-11-22 DIAGNOSIS — R9401 Abnormal electroencephalogram [EEG]: Secondary | ICD-10-CM | POA: Diagnosis not present

## 2022-11-22 DIAGNOSIS — R569 Unspecified convulsions: Secondary | ICD-10-CM | POA: Diagnosis not present

## 2022-11-23 DIAGNOSIS — R569 Unspecified convulsions: Secondary | ICD-10-CM | POA: Diagnosis not present

## 2022-11-23 DIAGNOSIS — R9401 Abnormal electroencephalogram [EEG]: Secondary | ICD-10-CM | POA: Diagnosis not present

## 2022-11-24 DIAGNOSIS — R9401 Abnormal electroencephalogram [EEG]: Secondary | ICD-10-CM | POA: Diagnosis not present

## 2022-11-24 DIAGNOSIS — R569 Unspecified convulsions: Secondary | ICD-10-CM | POA: Diagnosis not present

## 2022-12-04 ENCOUNTER — Other Ambulatory Visit: Payer: Self-pay | Admitting: Neurology

## 2022-12-04 DIAGNOSIS — R569 Unspecified convulsions: Secondary | ICD-10-CM

## 2022-12-04 NOTE — Procedures (Signed)
    Clinical History: This is a 47 y/o M who presents for evaluation of seizures.  INTERMITTENT MONITORING with VIDEO TECHNICAL SUMMARY: This AVEEG was performed using equipment provided by [employer] utilizing Bluetooth ( [employer] ) amplifiers with continuous EEGT attended video collection using encrypted remote transmission via [employer] secured cellular tower network with data rates for each AVEEG performed. This is a Therapist, music AVEEG, obtained, according to the 10-20 international electrode placement system, reformatted digitally into referential and bipolar montages. Data was acquired with a minimum of 21 bipolar connections and sampled at a minimum rate of 250 cycles per second per channel, maximum rate of 450 cycles per second per channel and two channels for EKG. The entire VEEG study was recorded through cable and or radio telemetry for subsequent analysis. Specified epochs of the AVEEG data were identified at the direction of the subject by the depression of a push button by the patient. Each patients event file included data acquired two minutes prior to the push button activation and continuing until two minutes afterwards. AVEEG files were reviewed on [employer] server, Licensed Software provided by [employer] with a digital high frequency filter set at 70 Hz and a low frequency filter set at 1 Hz with a paper speed of 34mm/s resulting in 10 seconds per digital page. This entire AVEEG was reviewed by the EEG Technologist. Random time samples, random sleep samples, clips, patient initiated push button files with included patient daily diary logs, EEG Technologist pruned data was reviewed and verified for accuracy and validity by the governing reading neurologist in full details. This AEEGV was fully compliant with all requirements for CPT 97500 for setup, patient education, take down and administered by an EEG technologist.  Long-Term EEG with Video was monitored  intermittently by a qualified EEG technologist for the entirety of the recording; quality check-ins were performed at a minimum of every two hours, checking and documenting real-time data and video to assure the integrity and quality of the recording (e.g., camera position, electrode integrity and impedance), and identify the need for maintenance. For intermittent monitoring, an EEG Technologist monitored no more than 12 patients concurrently. Diagnostic video was captured at least 80% of the time during the recording.  PATIENT EVENTS: There were no patient events noted or captured during this recording.  TECHNOLOGIST EVENTS: No clear epileptiform activity was detected by the reviewing neurodiagnostic technologist during the recording for further evaluation.  TIME SAMPLES: 10-minutes of every 2 hours recorded are reviewed as random time samples.  SLEEP SAMPLES: 5-minutes of every 24 hours recorded are reviewed as random sleep samples.  AWAKE: At maximal level of alertness, the posterior dominant background activity was continuous, reactive, low voltage rhythm of 8.5-9 Hz. This was symmetric, well-modulated, and attenuated with eye opening. Diffuse, symmetric, frontocentral beta range activity was present.  SLEEP: N1 Sleep (Stage 1) was observed and characterized by the disappearance of alpha rhythm and the appearance of vertex activity. N2 Sleep (Stage 2) was observed and characterized by vertex waves, K-complexes, and sleep spindles N3 (Stage 3) sleep was observed and characterized by high amplitude Delta activity of 20%. REM sleep was observed.  EKG: There were no arrhythmias or abnormalities noted during this recording.  Impression: This is a normal 72 hours ambulatory video EEG. There were no seizures, events or epileptiform discharges seen during this recording   Windell Norfolk, MD [facility]

## 2023-01-10 DIAGNOSIS — F4322 Adjustment disorder with anxiety: Secondary | ICD-10-CM | POA: Diagnosis not present

## 2023-01-24 DIAGNOSIS — F4322 Adjustment disorder with anxiety: Secondary | ICD-10-CM | POA: Diagnosis not present

## 2023-02-05 DIAGNOSIS — F4322 Adjustment disorder with anxiety: Secondary | ICD-10-CM | POA: Diagnosis not present

## 2023-02-06 ENCOUNTER — Ambulatory Visit (HOSPITAL_COMMUNITY)
Admission: EM | Admit: 2023-02-06 | Discharge: 2023-02-06 | Disposition: A | Discharge status: Home / Self Care | Payer: BC Managed Care – PPO | Attending: Physician Assistant | Admitting: Physician Assistant

## 2023-02-06 ENCOUNTER — Encounter (HOSPITAL_COMMUNITY): Payer: Self-pay

## 2023-02-06 DIAGNOSIS — R232 Flushing: Secondary | ICD-10-CM

## 2023-02-06 DIAGNOSIS — R569 Unspecified convulsions: Secondary | ICD-10-CM | POA: Diagnosis not present

## 2023-02-06 MED ORDER — LEVETIRACETAM 500 MG PO TABS: 500.0000 mg | ORAL_TABLET | Freq: Two times a day (BID) | ORAL | 0 refills | Status: DC

## 2023-02-06 NOTE — Discharge Instructions (Addendum)
Follow up with PCP and neurologist Contact neurologist via mychart or phone today Med refill today

## 2023-02-06 NOTE — ED Triage Notes (Signed)
Patient here today with c/o hot flashes and sweats since yesterday morning. Patient states that he had this  "weird feeling". He had 2 seizures over the summer and has been taking Levetiracetam 500mg  BID.   Patient states that he needs a refill on this medications and cannot get through to his doctor.

## 2023-02-06 NOTE — ED Provider Notes (Signed)
[facility]    CSN: [id number] Arrival date & time: 02/06/23  4098      History   Chief Complaint Chief Complaint  Patient presents with   Night Sweats    HPI Paul Maxwell is a 47 y.o. male.   Patient with history of seizures here c/w "hot flashes" x last night.  His wife reports he was experiencing them every 90 minutes or so.  He is currently on Keppra 500 mg BID, and has about 1 week left.  He has an appointment with neurologist in about 6 weeks. He notes the last time he had a seizure he had the same "hot flash" feeling. He did not check his temperature.  He denies any sick sx, n/v, HA, n/t, weakness.    Past Medical History:  Diagnosis Date   Seizures (HCC)     There are no active problems to display for this patient.   History reviewed. No pertinent surgical history.     Home Medications    Prior to Admission medications   Medication Sig Start Date End Date Taking? Authorizing Provider  diazePAM, 15 MG Dose, (VALTOCO 15 MG DOSE) 2 x 7.5 MG/0.1ML LQPK Place 15 mg into the nose as needed (For seizure lasting more than 2 minutes). 09/20/22   Windell Norfolk, MD  levETIRAcetam (KEPPRA) 500 MG tablet Take 1 tablet (500 mg total) by mouth 2 (two) times daily. 02/06/23 03/08/23  Evern Core, PA-C    Family History Family History  Problem Relation Age of Onset   Cancer Mother        breast   Heart disease Father 30       pacer    Social History Social History   Tobacco Use   Smoking status: Never   Smokeless tobacco: Never  Vaping Use   Vaping status: Never Used  Substance Use Topics   Alcohol use: Yes    Alcohol/week: 7.0 standard drinks of alcohol    Types: 7 Cans of beer per week   Drug use: No     Allergies   Patient has no known allergies.   Review of Systems Review of Systems  Constitutional:  Negative for chills, fatigue and fever.  HENT:  Negative for congestion, ear pain, nosebleeds, postnasal drip, rhinorrhea, sinus  pressure, sinus pain and sore throat.   Eyes:  Negative for pain and redness.  Respiratory:  Negative for cough, shortness of breath and wheezing.   Gastrointestinal:  Negative for abdominal pain, diarrhea, nausea and vomiting.  Musculoskeletal:  Negative for arthralgias and myalgias.  Skin:  Negative for rash.  Neurological:  Negative for dizziness, tremors, seizures, syncope, speech difficulty, weakness, light-headedness and headaches.  Hematological:  Negative for adenopathy. Does not bruise/bleed easily.  Psychiatric/Behavioral:  Negative for confusion and sleep disturbance.      Physical Exam Triage Vital Signs ED Triage Vitals  Encounter Vitals Group     BP 02/06/23 0958 129/84     Systolic BP Percentile --      Diastolic BP Percentile --      Pulse Rate 02/06/23 0958 70     Resp 02/06/23 0958 16     Temp 02/06/23 0958 99.2 F (37.3 C)     Temp Source 02/06/23 0958 Oral     SpO2 02/06/23 0958 96 %     Weight 02/06/23 0958 160 lb (72.6 kg)     Height 02/06/23 0958 5\' 11"  (1.803 m)     Head Circumference --  Peak Flow --      Pain Score 02/06/23 0956 0     Pain Loc --      Pain Education --      Exclude from Growth Chart --    No data found.  Updated Vital Signs BP 129/84 (BP Location: Left Arm)   Pulse 70   Temp 99.2 F (37.3 C) (Oral)   Resp 16   Ht 5\' 11"  (1.803 m)   Wt 160 lb (72.6 kg)   SpO2 96%   BMI 22.32 kg/m   Visual Acuity Right Eye Distance:   Left Eye Distance:   Bilateral Distance:    Right Eye Near:   Left Eye Near:    Bilateral Near:     Physical Exam Vitals and nursing note reviewed.  Constitutional:      Appearance: Normal appearance. He is well-developed.  HENT:     Head: Normocephalic and atraumatic.     Nose: Nose normal.     Mouth/Throat:     Mouth: Mucous membranes are moist.  Eyes:     General: No scleral icterus.    Conjunctiva/sclera: Conjunctivae normal.  Cardiovascular:     Rate and Rhythm: Normal rate and  regular rhythm.     Heart sounds: No murmur heard. Pulmonary:     Effort: Pulmonary effort is normal. No respiratory distress.     Breath sounds: No wheezing, rhonchi or rales.  Musculoskeletal:     Cervical back: Normal range of motion and neck supple. No rigidity.  Skin:    General: Skin is warm and dry.     Capillary Refill: Capillary refill takes less than 2 seconds.  Neurological:     General: No focal deficit present.     Mental Status: He is alert and oriented to person, place, and time.     GCS: GCS eye subscore is 4. GCS verbal subscore is 5. GCS motor subscore is 6.     Cranial Nerves: Cranial nerves 2-12 are intact. No cranial nerve deficit.     Motor: No weakness, tremor or abnormal muscle tone.     Gait: Gait normal.     Deep Tendon Reflexes:     Reflex Scores:      Patellar reflexes are 2+ on the right side and 2+ on the left side. Psychiatric:        Mood and Affect: Mood normal.      UC Treatments / Results  Labs (all labs ordered are listed, but only abnormal results are displayed) Labs Reviewed - No data to display  EKG   Radiology No results found.  Procedures Procedures (including critical care time)  Medications Ordered in UC Medications - No data to display  Initial Impression / Assessment and Plan / UC Course  I have reviewed the triage vital signs and the nursing notes.  Pertinent labs & imaging results that were available during my care of the patient were reviewed by me and considered in my medical decision making (see chart for details).     NAD No focal neurological deficit Refilled Keppra Follow up with neurologist Go to PCP Strict ED precautions provided Final Clinical Impressions(s) / UC Diagnoses   Final diagnoses:  Seizures (HCC)  Hot flashes     Discharge Instructions      Follow up with PCP and neurologist Contact neurologist via mychart or phone today Med refill today   ED Prescriptions     Medication Sig  Dispense Auth. Provider   levETIRAcetam (  KEPPRA) 500 MG tablet Take 1 tablet (500 mg total) by mouth 2 (two) times daily. 60 tablet Evern Core, PA-C      PDMP not reviewed this encounter.   Evern Core, PA-C 02/06/23 1139

## 2023-02-27 ENCOUNTER — Telehealth: Payer: Self-pay | Admitting: Neurology

## 2023-02-27 ENCOUNTER — Telehealth: Payer: Self-pay

## 2023-02-27 NOTE — Telephone Encounter (Signed)
 Pt request refill for levETIRAcetam (KEPPRA) 500 MG tablet send to [facility] [phone]

## 2023-02-27 NOTE — Telephone Encounter (Signed)
 Received refill request for keppra, upon looking at chart, patient wen tto the ED for seizure activity on 02/06/23. Call to patient to obtain more information. No answer. Left message to return call

## 2023-02-28 MED ORDER — LEVETIRACETAM 500 MG PO TABS: 500.0000 mg | ORAL_TABLET | Freq: Two times a day (BID) | ORAL | 2 refills | Status: DC

## 2023-02-28 NOTE — Telephone Encounter (Signed)
 Call to patient who reports 02/06/23 urgent care visit was sickness.hormonal related and no seizures. Continues to do well with Keppra 500 mg twice daily. Refill sent to pharmacy as  requested.

## 2023-03-26 ENCOUNTER — Ambulatory Visit (INDEPENDENT_AMBULATORY_CARE_PROVIDER_SITE_OTHER): Payer: BC Managed Care – PPO | Admitting: Neurology

## 2023-03-26 ENCOUNTER — Encounter: Payer: Self-pay | Admitting: Neurology

## 2023-03-26 VITALS — BP 110/70 | HR 64 | Ht 72.0 in | Wt 158.0 lb

## 2023-03-26 DIAGNOSIS — G40909 Epilepsy, unspecified, not intractable, without status epilepticus: Secondary | ICD-10-CM | POA: Diagnosis not present

## 2023-03-26 DIAGNOSIS — Z5181 Encounter for therapeutic drug level monitoring: Secondary | ICD-10-CM | POA: Diagnosis not present

## 2023-03-26 MED ORDER — LEVETIRACETAM 500 MG PO TABS: 500.0000 mg | ORAL_TABLET | Freq: Two times a day (BID) | ORAL | 3 refills | Status: AC

## 2023-03-26 MED ORDER — VALTOCO 15 MG DOSE 7.5 MG/0.1ML NA LQPK: 15.0000 mg | NASAL | 0 refills | Status: AC | PRN

## 2023-03-26 NOTE — Progress Notes (Signed)
[facility]  PATIENT: Paul Maxwell DOB: Dec 19, 1975  REQUESTING CLINICIAN: Ronnald Nian, MD HISTORY FROM: Patient/Spouse and chart review  REASON FOR VISIT: Seizure like activity    HISTORICAL  CHIEF COMPLAINT:  Chief Complaint  Patient presents with   Follow-up    Pt in 13, here with wife Paul Maxwell Pt is here following up on seizures. Pt states he has been doing well. Pt states last seizure was September/2024- none since.    INTERVAL HISTORY 03/26/2023:  Patient presents today for follow-up, he is accompanied by wife and young son.  Last visit was in July, at that time we did have a routine EEG which showed high amplitude theta activity with embedded sharp and slow wave activity seen during photic stimulation. Patient did have a breakthrough seizure in September and started on Keppra.  We did obtain an ambulatory EEG in October which was normal.  Since starting the Keppra, he has been doing well, denies any seizure or seizure-like activity.  He denies any side effect from the medication.  Currently he has no complaints.    HISTORY OF PRESENT ILLNESS:  This is a 48 year old man with no reported past medical history who is presenting after likely a seizure episode. Patient reports that is memory is not clear surrounding the events and he cannot remember the hospital stay and 2 days after leaving the hospital. Most of the history obtained from wife. Wife reports that patient has been working outside the days prior, he was working in the attic where it was very hot the day prior. The next morning he went to work, then came home for lunch. At that time, he reported that he was not feeling well. He went to the bathroom, and wife heard a loud noise, like a moaning sound, then he fell against the door.  She had to break the door to get in and found patient laying down, no convulsion at that time but he was purple in color, foaming at the mouth, no blood noted and having deep  breathing like deep snoring.  She was not sure of any urinary incontinence.Marland Kitchen  EMS was called and patient was taken to the ED.  Per report, EMS have to administer patient a medication due to patient being combative.  In the ED his head CT was negative for any acute abnormality.  His lactic acid was elevated at 14, his urine for positive was for [facility] and his specific gravity was elevated at 1.046.  Since being back home, he reported the first 2 days was left ear but now is back to his normal self.  He does have some muscle muscular soreness but otherwise doing well.  Of note patient reports for the past few years he has been having episodes of increased stress, difficulty breathing, possible palpitation, feel like he needs to lay down.  Again these episodes have been worsening in  frequency.  He tells me that is very difficult to describe them but it feel like is related to stress at work and at home.   Handedness: Right handed   Onset: July 18  Seizure Type:  Fall, unresponsiveness,   Current frequency: Only once   Any injuries from seizures: Denies  Seizure risk factors: Denies   Previous ASMs: None   Currenty ASMs: Levetiracetam 500 mg twice daily.   ASMs side effects: N/A   Brain Images: Normal Head CT   Previous EEGs: Not previously done    OTHER MEDICAL CONDITIONS:  Denies other medication conditions   REVIEW OF SYSTEMS: Full 14 system review of systems performed and negative with exception of: As noted in the HPI   ALLERGIES: No Known Allergies  HOME MEDICATIONS: Outpatient Medications Prior to Visit  Medication Sig Dispense Refill   diazePAM, 15 MG Dose, (VALTOCO 15 MG DOSE) 2 x 7.5 MG/0.1ML LQPK Place 15 mg into the nose as needed (For seizure lasting more than 2 minutes). 2 each 0   levETIRAcetam (KEPPRA) 500 MG tablet Take 1 tablet (500 mg total) by mouth 2 (two) times daily. 60 tablet 2   No facility-administered medications prior to visit.    PAST MEDICAL  HISTORY: Past Medical History:  Diagnosis Date   Seizures (HCC)     PAST SURGICAL HISTORY: History reviewed. No pertinent surgical history.  FAMILY HISTORY: Family History  Problem Relation Age of Onset   Cancer Mother        breast   Heart disease Father 37       pacer    SOCIAL HISTORY: Social History   Socioeconomic History   Marital status: Married    Spouse name: Paul Maxwell   Number of children: 2   Years of education: Not on file   Highest education level: Bachelor's degree (e.g., BA, AB, BS)  Occupational History   Not on file  Tobacco Use   Smoking status: Never   Smokeless tobacco: Never  Vaping Use   Vaping status: Never Used  Substance and Sexual Activity   Alcohol use: Yes    Alcohol/week: 7.0 standard drinks of alcohol    Types: 7 Cans of beer per week   Drug use: No   Sexual activity: Yes    Birth control/protection: None  Other Topics Concern   Not on file  Social History Narrative   Not on file   Social Drivers of Health   Financial Resource Strain: Not on file  Food Insecurity: Not on file  Transportation Needs: Not on file  Physical Activity: Not on file  Stress: Not on file  Social Connections: Not on file  Intimate Partner Violence: Not on file    PHYSICAL EXAM  GENERAL EXAM/CONSTITUTIONAL: Vitals:  Vitals:   03/26/23 1113  BP: 110/70  Pulse: 64  Weight: 158 lb (71.7 kg)  Height: 6' (1.829 m)   Body mass index is 21.43 kg/m. Wt Readings from Last 3 Encounters:  03/26/23 158 lb (71.7 kg)  02/06/23 160 lb (72.6 kg)  09/20/22 154 lb (69.9 kg)   Patient is in no distress; well developed, nourished and groomed; neck is supple  MUSCULOSKELETAL: Gait, strength, tone, movements noted in Neurologic exam below  NEUROLOGIC: MENTAL STATUS:      No data to display         awake, alert, oriented to person, place and time recent and remote memory intact normal attention and concentration language fluent, comprehension intact,  naming intact fund of knowledge appropriate  CRANIAL NERVE:  2nd, 3rd, 4th, 6th - Visual fields full to confrontation, extraocular muscles intact, no nystagmus 5th - facial sensation symmetric 7th - facial strength symmetric 8th - hearing intact 9th - palate elevates symmetrically, uvula midline 11th - shoulder shrug symmetric 12th - tongue protrusion midline  MOTOR:  normal bulk and tone, full strength in the BUE, BLE  SENSORY:  normal and symmetric to light touch  COORDINATION:  finger-nose-finger, fine finger movements normal  GAIT/STATION:  normal   DIAGNOSTIC DATA (LABS, IMAGING, TESTING) - I reviewed patient records, labs,  notes, testing and imaging myself where available.  Lab Results  Component Value Date   WBC 5.1 09/13/2022   HGB 16.1 09/13/2022   HCT 47.6 09/13/2022   MCV 91 09/13/2022   PLT 210 09/13/2022      Component Value Date/Time   NA 141 09/13/2022 1141   K 4.5 09/13/2022 1141   CL 101 09/13/2022 1141   CO2 25 09/13/2022 1141   GLUCOSE 85 09/13/2022 1141   GLUCOSE 183 (H) 09/07/2022 1419   BUN 12 09/13/2022 1141   CREATININE 1.08 09/13/2022 1141   CALCIUM 9.7 09/13/2022 1141   PROT 7.3 09/13/2022 1141   ALBUMIN 4.8 09/13/2022 1141   AST 33 09/13/2022 1141   ALT 20 09/13/2022 1141   ALKPHOS 48 09/13/2022 1141   BILITOT 0.6 09/13/2022 1141   GFRNONAA 54 (L) 09/07/2022 1400   GFRAA 92 05/07/2017 1034   Lab Results  Component Value Date   CHOL 234 (H) 02/07/2022   HDL 69 02/07/2022   LDLCALC 153 (H) 02/07/2022   TRIG 70 02/07/2022   No results found for: "HGBA1C" No results found for: "VITAMINB12" Lab Results  Component Value Date   TSH 5.184 (H) 09/07/2022    Head CT 09/07/2022 No acute intracranial abnormality.   Routine EEG 09/25/2022 Diffuse high amplitude theta activity with embedded sharp and slow wave activity seen during photic   Ambulatory EEG 12/04/2022 This is a normal 72 hours ambulatory video EEG. There were no  seizures, events or epileptiform discharges seen during this recording    ASSESSMENT AND PLAN  48 y.o. year old male  with no reported past medical history who is presenting for follow-up for his epilepsy.  He did have 2 seizures 1 in July and another 1 in September 2024.  Following his second seizure he has been on Keppra 500 mg twice daily, tolerates the medication very well and no additional seizure or seizure like activity.  Denies side effects. Plan will be to obtain obtain a Keppra level and continue patient on Keppra 500 mg twice daily.  Advised him to contact me if he does have a breakthrough seizure otherwise I will see him in 6 months for follow-up or sooner if worse.  He voiced understanding.    1. Nonintractable epilepsy without status epilepticus, unspecified epilepsy type (HCC)   2. Therapeutic drug monitoring      Patient Instructions  Continue with Keppra 500 mg twice daily, refill given Will obtain a Keppra level Continue your other medications Follow-up in 6 months or sooner if worse.   Per [facility] statutes, patients with seizures are not allowed to drive until they have been seizure-free for six months.  Other recommendations include using caution when using heavy equipment or power tools. Avoid working on ladders or at heights. Take showers instead of baths.  Do not swim alone.  Ensure the water temperature is not too high on the home water heater. Do not go swimming alone. Do not lock yourself in a room alone (i.e. bathroom). When caring for infants or small children, sit down when holding, feeding, or changing them to minimize risk of injury to the child in the event you have a seizure. Maintain good sleep hygiene. Avoid alcohol.  Also recommend adequate sleep, hydration, good diet and minimize stress.   During the Seizure  - First, ensure adequate ventilation and place patients on the floor on their left side  Loosen clothing around the neck and ensure the  airway is patent.  If the patient is clenching the teeth, do not force the mouth open with any object as this can cause severe damage - Remove all items from the surrounding that can be hazardous. The patient may be oblivious to what's happening and may not even know what he or she is doing. If the patient is confused and wandering, either gently guide him/her away and block access to outside areas - Reassure the individual and be comforting - Call 911. In most cases, the seizure ends before EMS arrives. However, there are cases when seizures may last over 3 to 5 minutes. Or the individual may have developed breathing difficulties or severe injuries. If a pregnant patient or a person with diabetes develops a seizure, it is prudent to call an ambulance. - Finally, if the patient does not regain full consciousness, then call EMS. Most patients will remain confused for about 45 to 90 minutes after a seizure, so you must use judgment in calling for help. - Avoid restraints but make sure the patient is in a bed with padded side rails - Place the individual in a lateral position with the neck slightly flexed; this will help the saliva drain from the mouth and prevent the tongue from falling backward - Remove all nearby furniture and other hazards from the area - Provide verbal assurance as the individual is regaining consciousness - Provide the patient with privacy if possible - Call for help and start treatment as ordered by the caregiver   After the Seizure (Postictal Stage)  After a seizure, most patients experience confusion, fatigue, muscle pain and/or a headache. Thus, one should permit the individual to sleep. For the next few days, reassurance is essential. Being calm and helping reorient the person is also of importance.  Most seizures are painless and end spontaneously. Seizures are not harmful to others but can lead to complications such as stress on the lungs, brain and the heart. Individuals  with prior lung problems may develop labored breathing and respiratory distress.     Orders Placed This Encounter  Procedures   Levetiracetam level    Meds ordered this encounter  Medications   levETIRAcetam (KEPPRA) 500 MG tablet    Sig: Take 1 tablet (500 mg total) by mouth 2 (two) times daily.    Dispense:  180 tablet    Refill:  3   diazePAM, 15 MG Dose, (VALTOCO 15 MG DOSE) 2 x 7.5 MG/0.1ML LQPK    Sig: Place 15 mg into the nose as needed (For seizure lasting more than 2 minutes).    Dispense:  2 each    Refill:  0    Return in about 6 months (around 09/23/2023).    Windell Norfolk, MD 03/26/2023, 6:40 PM  [facility] [address], [address] [place], [place] [address] [phone]

## 2023-03-26 NOTE — Patient Instructions (Signed)
Continue with Keppra 500 mg twice daily, refill given Will obtain a Keppra level Continue your other medications Follow-up in 6 months or sooner if worse.

## 2023-03-28 LAB — LEVETIRACETAM LEVEL: Levetiracetam Lvl: 10.2 ug/mL (ref 10.0–40.0)

## 2023-04-01 ENCOUNTER — Encounter: Payer: Self-pay | Admitting: Neurology

## 2023-04-17 ENCOUNTER — Encounter: Payer: Self-pay | Admitting: Internal Medicine

## 2023-04-23 DIAGNOSIS — G40309 Generalized idiopathic epilepsy and epileptic syndromes, not intractable, without status epilepticus: Secondary | ICD-10-CM | POA: Insufficient documentation

## 2023-04-24 ENCOUNTER — Encounter: Payer: Self-pay | Admitting: Family Medicine

## 2023-04-24 ENCOUNTER — Ambulatory Visit (INDEPENDENT_AMBULATORY_CARE_PROVIDER_SITE_OTHER): Payer: BC Managed Care – PPO | Admitting: Family Medicine

## 2023-04-24 VITALS — BP 110/72 | HR 47 | Ht 70.75 in | Wt 165.8 lb

## 2023-04-24 DIAGNOSIS — Z1211 Encounter for screening for malignant neoplasm of colon: Secondary | ICD-10-CM

## 2023-04-24 DIAGNOSIS — G40309 Generalized idiopathic epilepsy and epileptic syndromes, not intractable, without status epilepticus: Secondary | ICD-10-CM

## 2023-04-24 DIAGNOSIS — Z1159 Encounter for screening for other viral diseases: Secondary | ICD-10-CM

## 2023-04-24 DIAGNOSIS — Z0000 Encounter for general adult medical examination without abnormal findings: Secondary | ICD-10-CM

## 2023-04-24 NOTE — Progress Notes (Signed)
 Complete physical exam  Patient: Paul Maxwell   DOB: February 27, 1975   48 y.o. Male  MRN: [id number]  Subjective:    Chief Complaint  Patient presents with   Annual Exam    Fasting.     Paul Maxwell is a 48 y.o. male who presents today for a complete physical exam. He reports consuming a general diet. Home exercise routine includes cycling, gardening, and a physically demanding job.Marland Kitchen He generally feels fairly well. He reports sleeping well. He is Presently taking Keppra due to seizure activity.  He has followed up with neurology and is scheduled to see them on a semiannual basis and then will be decreased to annually.  He has had no difficulty with the medication.  It has been 6 months and we will start driving.  He seems to be comfortable with the present situation.  He is getting ready to change jobs.  His home life is going quite well.  He has no other concerns or questions.  Otherwise family and social history as well as health maintenance and immunizations was reviewed.  Most recent fall risk assessment:    04/24/2023   11:22 AM  Fall Risk   Falls in the past year? 0  Number falls in past yr: 0  Injury with Fall? 0     Most recent depression screenings:    04/24/2023   11:22 AM 02/07/2022   10:58 AM  PHQ 2/9 Scores  PHQ - 2 Score 0 0    Vision:Not within last year  and Dental: No current dental problems and Last dental visit: January 2025    Patient Care Team: Ronnald Nian, MD as PCP - General (Family Medicine)   Outpatient Medications Prior to Visit  Medication Sig   diazePAM, 15 MG Dose, (VALTOCO 15 MG DOSE) 2 x 7.5 MG/0.1ML LQPK Place 15 mg into the nose as needed (For seizure lasting more than 2 minutes).   levETIRAcetam (KEPPRA) 500 MG tablet Take 1 tablet (500 mg total) by mouth 2 (two) times daily.   No facility-administered medications prior to visit.    Review of Systems  All other systems reviewed and are negative.         Objective:        Physical Exam  Alert and in no distress. Tympanic membranes and canals are normal. Pharyngeal area is normal. Neck is supple without adenopathy or thyromegaly. Cardiac exam shows a regular sinus rhythm without murmurs or gallops. Lungs are clear to auscultation.      Assessment & Plan:    Routine general medical examination at a health care facility - Plan: Lipid panel, CBC with Differential/Platelet, Comprehensive metabolic panel  Nonintractable generalized idiopathic epilepsy without status epilepticus (HCC) - Plan: CBC with Differential/Platelet, Comprehensive metabolic panel  Screening for colon cancer - Plan: Cologuard  Need for hepatitis C screening test - Plan: Hepatitis C antibody  Immunization History  Administered Date(s) Administered   Influenza,inj,Quad PF,6+ Mos 10/10/2018, 02/07/2022   Pfizer(Comirnaty)Fall Seasonal Vaccine 12 years and older 02/07/2022   Tdap 05/07/2017, 07/24/2019    Health Maintenance  Topic Date Due   HIV Screening  Never done   Hepatitis C Screening  Never done   Colonoscopy  Never done   INFLUENZA VACCINE  09/21/2022   COVID-19 Vaccine (2 - 2024-25 season) 10/22/2022   DTaP/Tdap/Td (3 - Td or Tdap) 07/23/2029   HPV VACCINES  Aged Out     Problem List Items Addressed This Visit  Nonintractable generalized idiopathic epilepsy without status epilepticus (HCC)   Relevant Orders   CBC with Differential/Platelet   Comprehensive metabolic panel   Other Visit Diagnoses       Routine general medical examination at a health care facility    -  Primary   Relevant Orders   Lipid panel   CBC with Differential/Platelet   Comprehensive metabolic panel     Screening for colon cancer       Relevant Orders   Cologuard     Need for hepatitis C screening test       Relevant Orders   Hepatitis C antibody     He seems to have a good grasp of the present situation and plans to continue on Keppra and follow-up with neurology as  needed. Return in about 1 year (around 04/23/2024).     Sharlot Gowda, MD

## 2023-04-25 ENCOUNTER — Encounter: Payer: Self-pay | Admitting: Family Medicine

## 2023-04-25 LAB — HEPATITIS C ANTIBODY: Hep C Virus Ab: NONREACTIVE

## 2023-04-25 LAB — COMPREHENSIVE METABOLIC PANEL
ALT: 17 IU/L (ref 0–44)
AST: 21 IU/L (ref 0–40)
Albumin: 4.3 g/dL (ref 4.1–5.1)
Alkaline Phosphatase: 37 IU/L — ABNORMAL LOW (ref 44–121)
BUN/Creatinine Ratio: 13 (ref 9–20)
BUN: 13 mg/dL (ref 6–24)
Bilirubin Total: 0.4 mg/dL (ref 0.0–1.2)
CO2: 24 mmol/L (ref 20–29)
Calcium: 9.3 mg/dL (ref 8.7–10.2)
Chloride: 106 mmol/L (ref 96–106)
Creatinine, Ser: 0.98 mg/dL (ref 0.76–1.27)
Globulin, Total: 2.2 g/dL (ref 1.5–4.5)
Glucose: 84 mg/dL (ref 70–99)
Potassium: 4.5 mmol/L (ref 3.5–5.2)
Sodium: 142 mmol/L (ref 134–144)
Total Protein: 6.5 g/dL (ref 6.0–8.5)
eGFR: 96 mL/min/{1.73_m2} (ref >59)

## 2023-04-25 LAB — CBC WITH DIFFERENTIAL/PLATELET
Basophils Absolute: 0 10*3/uL (ref 0.0–0.2)
Basos: 1 %
EOS (ABSOLUTE): 0.1 10*3/uL (ref 0.0–0.4)
Eos: 2 %
Hematocrit: 41.1 % (ref 37.5–51.0)
Hemoglobin: 14.1 g/dL (ref 13.0–17.7)
Immature Grans (Abs): 0 10*3/uL (ref 0.0–0.1)
Immature Granulocytes: 1 %
Lymphocytes Absolute: 0.8 10*3/uL (ref 0.7–3.1)
Lymphs: 21 %
MCH: 31.1 pg (ref 26.6–33.0)
MCHC: 34.3 g/dL (ref 31.5–35.7)
MCV: 91 fL (ref 79–97)
Monocytes Absolute: 0.3 10*3/uL (ref 0.1–0.9)
Monocytes: 8 %
Neutrophils Absolute: 2.7 10*3/uL (ref 1.4–7.0)
Neutrophils: 67 %
Platelets: 185 10*3/uL (ref 150–450)
RBC: 4.53 x10E6/uL (ref 4.14–5.80)
RDW: 12.5 % (ref 11.6–15.4)
WBC: 4 10*3/uL (ref 3.4–10.8)

## 2023-04-25 LAB — LIPID PANEL
Chol/HDL Ratio: 3.3 ratio (ref 0.0–5.0)
Cholesterol, Total: 220 mg/dL — ABNORMAL HIGH (ref 100–199)
HDL: 66 mg/dL (ref >39)
LDL Chol Calc (NIH): 144 mg/dL — ABNORMAL HIGH (ref 0–99)
Triglycerides: 60 mg/dL (ref 0–149)
VLDL Cholesterol Cal: 10 mg/dL (ref 5–40)

## 2023-05-03 DIAGNOSIS — Z1211 Encounter for screening for malignant neoplasm of colon: Secondary | ICD-10-CM | POA: Diagnosis not present

## 2023-05-12 LAB — COLOGUARD: COLOGUARD: NEGATIVE

## 2023-10-09 NOTE — Progress Notes (Deleted)
 No chief complaint on file.   HISTORY OF PRESENT ILLNESS:  10/09/23 ALL:  Paul Maxwell is a 48 y.o. male here today for follow up for seizures. He was last seen by Dr Gregg 03/2023 and doing well on levetiracetam  500mg  BID. Since,   [facility]     HISTORY (copied from Dr Janean previous note)  INTERVAL HISTORY 03/26/2023:  Patient presents today for follow-up, he is accompanied by wife and young son.  Last visit was in July, at that time we did have a routine EEG which showed high amplitude theta activity with embedded sharp and slow wave activity seen during photic stimulation. Patient did have a breakthrough seizure in September and started on Keppra .  We did obtain an ambulatory EEG in October which was normal.  Since starting the Keppra , he has been doing well, denies any seizure or seizure-like activity.  He denies any side effect from the medication.  Currently he has no complaints.    HISTORY OF PRESENT ILLNESS:  This is a 48 year old man with no reported past medical history who is presenting after likely a seizure episode. Patient reports that is memory is not clear surrounding the events and he cannot remember the hospital stay and 2 days after leaving the hospital. Most of the history obtained from wife. Wife reports that patient has been working outside the days prior, he was working in the attic where it was very hot the day prior. The next morning he went to work, then came home for lunch. At that time, he reported that he was not feeling well. He went to the bathroom, and wife heard a loud noise, like a moaning sound, then he fell against the door.  She had to break the door to get in and found patient laying down, no convulsion at that time but he was purple in color, foaming at the mouth, no blood noted and having deep breathing like deep snoring.  She was not sure of any urinary incontinence.[facility]  EMS was called and patient was taken to the ED.  Per report, EMS have to  administer patient a medication due to patient being combative.  In the ED his head CT was negative for any acute abnormality.  His lactic acid was elevated at 14, his urine for positive was for [facility] and his specific gravity was elevated at 1.046.  Since being back home, he reported the first 2 days was left ear but now is back to his normal self.  He does have some muscle muscular soreness but otherwise doing well.   Of note patient reports for the past few years he has been having episodes of increased stress, difficulty breathing, possible palpitation, feel like he needs to lay down.  Again these episodes have been worsening in  frequency.  He tells me that is very difficult to describe them but it feel like is related to stress at work and at home.     Handedness: Right handed    Onset: July 18   Seizure Type:  Fall, unresponsiveness,    Current frequency: Only once    Any injuries from seizures: Denies   Seizure risk factors: Denies    Previous ASMs: None    Currenty ASMs: Levetiracetam  500 mg twice daily.    ASMs side effects: N/A    Brain Images: Normal Head CT    Previous EEGs: Not previously done      OTHER MEDICAL CONDITIONS: Denies other medication conditions  REVIEW OF SYSTEMS: Out of a complete 14 system review of symptoms, the patient complains only of the following symptoms, and all other reviewed systems are negative.   ALLERGIES: No Known Allergies   HOME MEDICATIONS: Outpatient Medications Prior to Visit  Medication Sig Dispense Refill   diazePAM , 15 MG Dose, (VALTOCO  15 MG DOSE) 2 x 7.5 MG/0.1ML LQPK Place 15 mg into the nose as needed (For seizure lasting more than 2 minutes). 2 each 0   levETIRAcetam  (KEPPRA ) 500 MG tablet Take 1 tablet (500 mg total) by mouth 2 (two) times daily. 180 tablet 3   No facility-administered medications prior to visit.     PAST MEDICAL HISTORY: Past Medical History:  Diagnosis Date   Seizures (HCC)      PAST  SURGICAL HISTORY: No past surgical history on file.   FAMILY HISTORY: Family History  Problem Relation Age of Onset   Cancer Mother        breast   Heart disease Father 66       pacer     SOCIAL HISTORY: Social History   Socioeconomic History   Marital status: Married    Spouse name: dawn   Number of children: 2   Years of education: Not on file   Highest education level: Bachelor's degree (e.g., BA, AB, BS)  Occupational History   Not on file  Tobacco Use   Smoking status: Never   Smokeless tobacco: Never  Vaping Use   Vaping status: Never Used  Substance and Sexual Activity   Alcohol use: Yes    Alcohol/week: 7.0 standard drinks of alcohol    Types: 7 Cans of beer per week   Drug use: No   Sexual activity: Yes    Birth control/protection: None  Other Topics Concern   Not on file  Social History Narrative   Not on file   Social Drivers of Health   Financial Resource Strain: Low Risk  (04/24/2023)   Overall Financial Resource Strain (CARDIA)    Difficulty of Paying Living Expenses: Not very hard  Food Insecurity: No Food Insecurity (04/24/2023)   Hunger Vital Sign    Worried About Running Out of Food in the Last Year: Never true    Ran Out of Food in the Last Year: Never true  Transportation Needs: No Transportation Needs (04/24/2023)   PRAPARE - Administrator, Civil Service (Medical): No    Lack of Transportation (Non-Medical): No  Physical Activity: Sufficiently Active (04/24/2023)   Exercise Vital Sign    Days of Exercise per Week: 7 days    Minutes of Exercise per Session: 60 min  Stress: Stress Concern Present (04/24/2023)   [employer] of Occupational Health - Occupational Stress Questionnaire    Feeling of Stress : Rather much  Social Connections: Moderately Isolated (04/24/2023)   Social Connection and Isolation Panel    Frequency of Communication with Friends and Family: Twice a week    Frequency of Social Gatherings with Friends and  Family: Twice a week    Attends Religious Services: Never    Database administrator or Organizations: No    Attends Banker Meetings: Never    Marital Status: Married  Catering manager Violence: Not At Risk (04/24/2023)   Humiliation, Afraid, Rape, and Kick questionnaire    Fear of Current or Ex-Partner: No    Emotionally Abused: No    Physically Abused: No    Sexually Abused: No     PHYSICAL EXAM  There were no vitals filed for this visit. There is no height or weight on file to calculate BMI.  Generalized: Well developed, in no acute distress  Cardiology: normal rate and rhythm, no murmur auscultated  Respiratory: clear to auscultation bilaterally    Neurological examination  Mentation: Alert oriented to time, place, history taking. Follows all commands speech and language fluent Cranial nerve II-XII: Pupils were equal round reactive to light. Extraocular movements were full, visual field were full on confrontational test. Facial sensation and strength were normal. Uvula tongue midline. Head turning and shoulder shrug  were normal and symmetric. Motor: The motor testing reveals 5 over 5 strength of all 4 extremities. Good symmetric motor tone is noted throughout.  Sensory: Sensory testing is intact to soft touch on all 4 extremities. No evidence of extinction is noted.  Coordination: Cerebellar testing reveals good finger-nose-finger and heel-to-shin bilaterally.  Gait and station: Gait is normal. Tandem gait is normal. Romberg is negative. No drift is seen.  Reflexes: Deep tendon reflexes are symmetric and normal bilaterally.    DIAGNOSTIC DATA (LABS, IMAGING, TESTING) - I reviewed patient records, labs, notes, testing and imaging myself where available.  Lab Results  Component Value Date   WBC 4.0 04/24/2023   HGB 14.1 04/24/2023   HCT 41.1 04/24/2023   MCV 91 04/24/2023   PLT 185 04/24/2023      Component Value Date/Time   NA 142 04/24/2023 1611   K 4.5  04/24/2023 1611   CL 106 04/24/2023 1611   CO2 24 04/24/2023 1611   GLUCOSE 84 04/24/2023 1611   GLUCOSE 183 (H) 09/07/2022 1419   BUN 13 04/24/2023 1611   CREATININE 0.98 04/24/2023 1611   CALCIUM 9.3 04/24/2023 1611   PROT 6.5 04/24/2023 1611   ALBUMIN 4.3 04/24/2023 1611   AST 21 04/24/2023 1611   ALT 17 04/24/2023 1611   ALKPHOS 37 (L) 04/24/2023 1611   BILITOT 0.4 04/24/2023 1611   GFRNONAA 54 (L) 09/07/2022 1400   GFRAA 92 05/07/2017 1034   Lab Results  Component Value Date   CHOL 220 (H) 04/24/2023   HDL 66 04/24/2023   LDLCALC 144 (H) 04/24/2023   TRIG 60 04/24/2023   CHOLHDL 3.3 04/24/2023   No results found for: HGBA1C No results found for: VITAMINB12 Lab Results  Component Value Date   TSH 5.184 (H) 09/07/2022        No data to display               No data to display           ASSESSMENT AND PLAN  48 y.o. year old male  has a past medical history of Seizures (HCC). here with    No diagnosis found.  Eva ONEIDA Ditch ***.  Healthy lifestyle habits encouraged. *** will follow up with PCP as directed. *** will return to see me in ***, sooner if needed. *** verbalizes understanding and agreement with this plan.   No orders of the defined types were placed in this encounter.    No orders of the defined types were placed in this encounter.    Greig Forbes, MSN, FNP-C 10/09/2023, 4:46 PM  [facility] [address] [place], [place] [address] [phone]

## 2023-10-09 NOTE — Patient Instructions (Incomplete)
Below is our plan:  We will continue levetiracetam 500mg  twice daily.   Please make sure you are consistent with timing of seizure medication. I recommend annual visit with primary care provider (PCP) for complete physical and routine blood work. I recommend daily intake of vitamin D (400-800iu) and calcium (800-1000mg ) for bone health. Discuss Dexa screening with PCP.   According to [place] law, you can not drive unless you are seizure / syncope free for at least 6 months and under physician's care.  Please maintain precautions. Do not participate in activities where a loss of awareness could harm you or someone else. No swimming alone, no tub bathing, no hot tubs, no driving, no operating motorized vehicles (cars, ATVs, motocycles, etc), lawnmowers, power tools or firearms. No standing at heights, such as rooftops, ladders or stairs. Avoid hot objects such as stoves, heaters, open fires. Wear a helmet when riding a bicycle, scooter, skateboard, etc. and avoid areas of traffic. Set your water heater to 120 degrees or less.  SUDEP is the sudden, unexpected death of someone with epilepsy, who was otherwise healthy. In SUDEP cases, no other cause of death is found when an autopsy is done. Each year, more than 1 in 1,000 people with epilepsy die from SUDEP. This is the leading cause of death in people with uncontrolled seizures. Until further answers are available, the best way to prevent SUDEP is to lower your risk by controlling seizures. Research has found that people with all types of epilepsy that experience convulsive seizures can be at risk.  Please make sure you are staying well hydrated. I recommend 50-60 ounces daily. Well balanced diet and regular exercise encouraged. Consistent sleep schedule with 6-8 hours recommended.   Please continue follow up with care team as directed.   Follow up with me in 1 year   You may receive a survey regarding today's visit. I encourage you to leave honest feed  back as I do use this information to improve patient care. Thank you for seeing me today!

## 2023-10-10 ENCOUNTER — Ambulatory Visit: Payer: BC Managed Care – PPO | Admitting: Family Medicine

## 2023-10-10 ENCOUNTER — Encounter: Payer: Self-pay | Admitting: Family Medicine

## 2023-10-10 DIAGNOSIS — G40909 Epilepsy, unspecified, not intractable, without status epilepticus: Secondary | ICD-10-CM

## 2023-10-10 DIAGNOSIS — Z5181 Encounter for therapeutic drug level monitoring: Secondary | ICD-10-CM

## 2024-01-09 IMAGING — CT CT ABD-PELV W/ CM
2 of 5 series · 16 of 46 positions shown, 18 images · IV contrast (agent unspecified)
Comparison: None.

CLINICAL DATA: Right lower quadrant pain for 1 hour, initial
encounter

EXAM:
CT ABDOMEN AND PELVIS WITH CONTRAST
TECHNIQUE: Multidetector CT imaging of the abdomen and pelvis was performed
using the standard protocol following bolus administration of
intravenous contrast.

[Series 4: abd/ pelvis 5.0 i30f 2 · axial · 0.64mm/px · z∈[+700,+1095]mm · 13 of 89 slices shown, 15 images]
[im 5/89  soft-tissue]
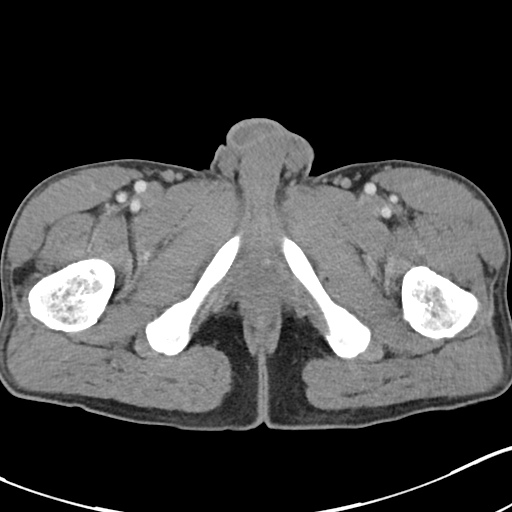
[im 5/89  bone]
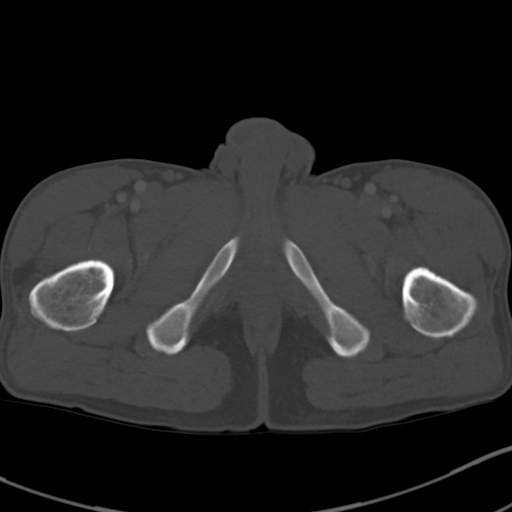
[im 14/89  soft-tissue]
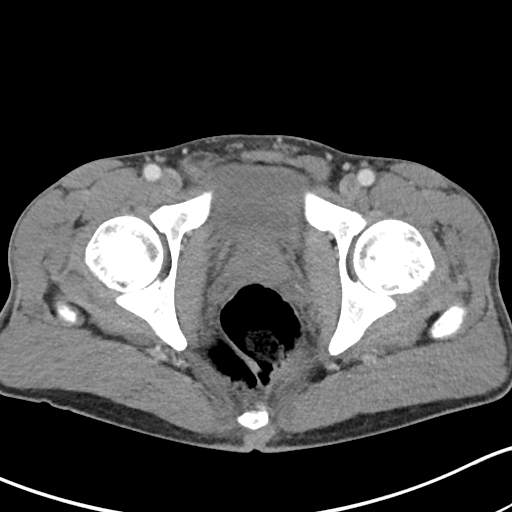
[im 19/89  soft-tissue]
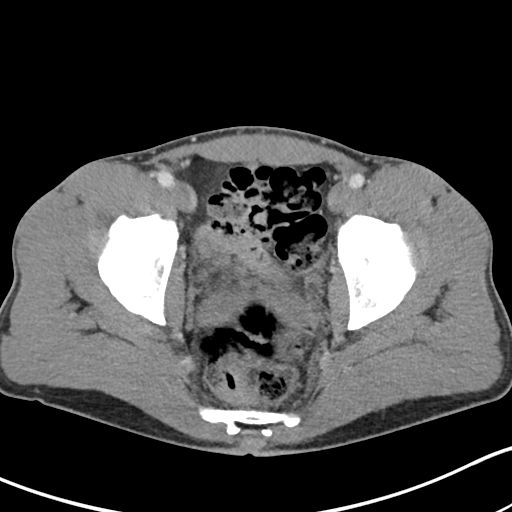
[im 24/89  soft-tissue]
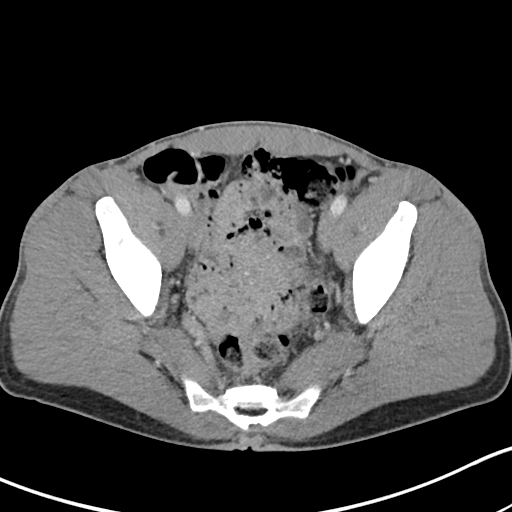
[im 33/89  soft-tissue]
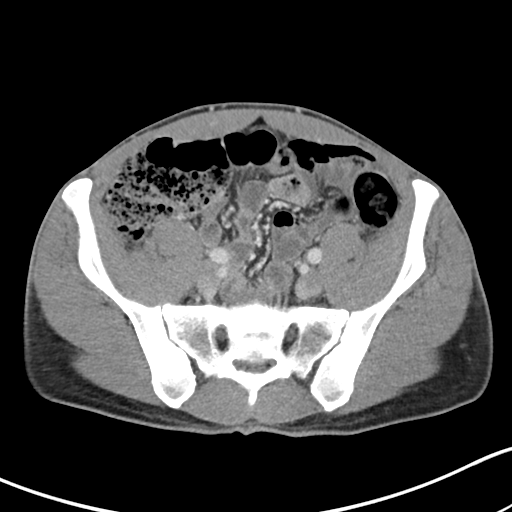
[im 38/89  soft-tissue]
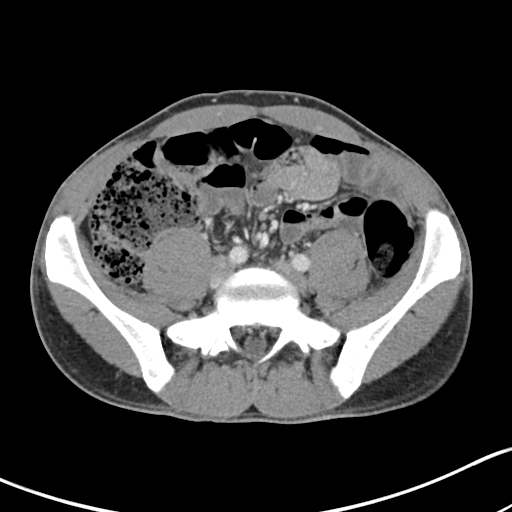
[im 47/89  soft-tissue]
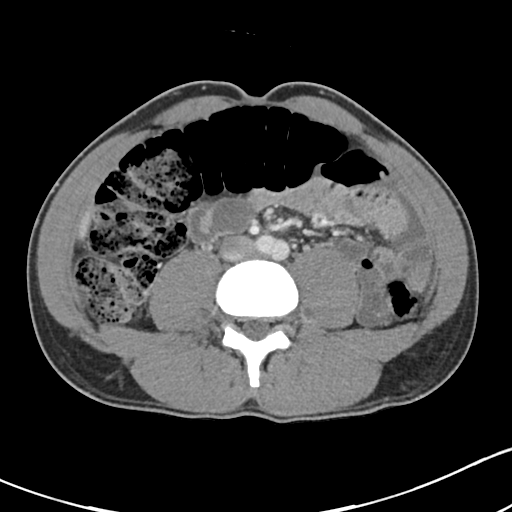
[im 51/89  soft-tissue]
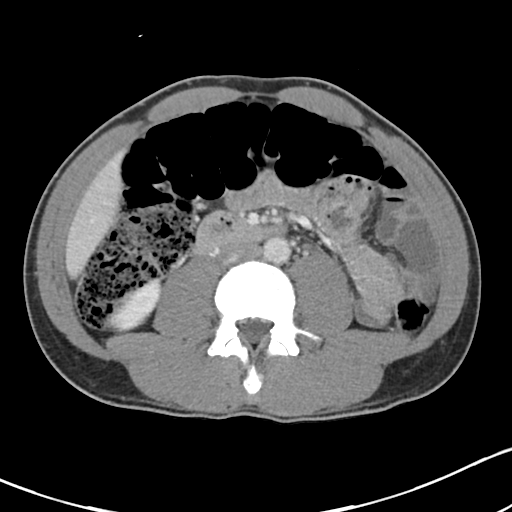
[im 56/89  soft-tissue]
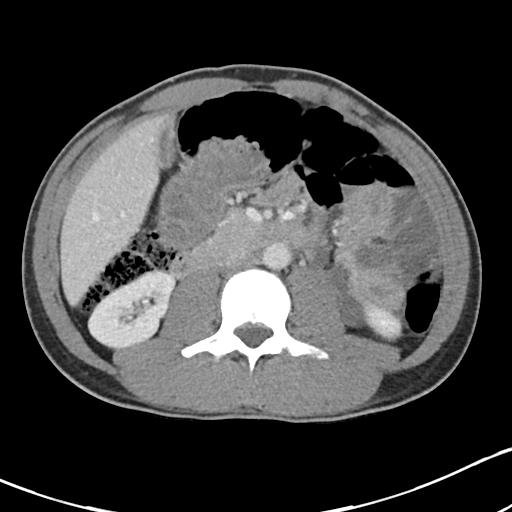
[im 56/89  bone]
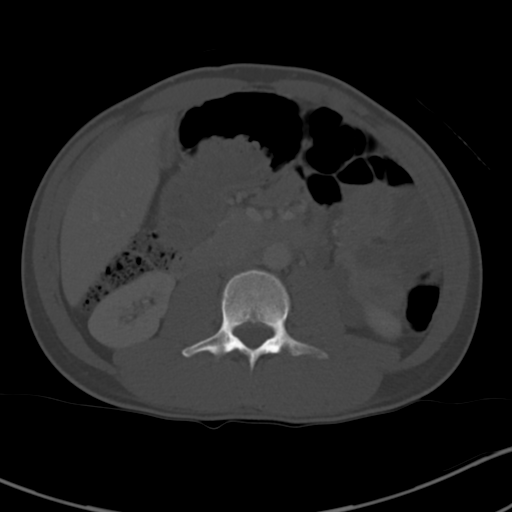
[im 65/89  soft-tissue]
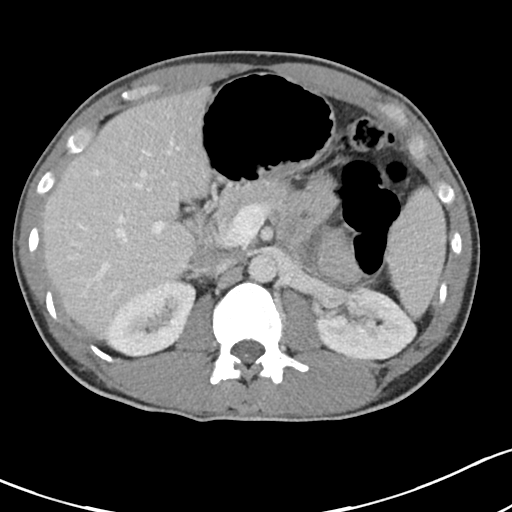
[im 70/89  soft-tissue]
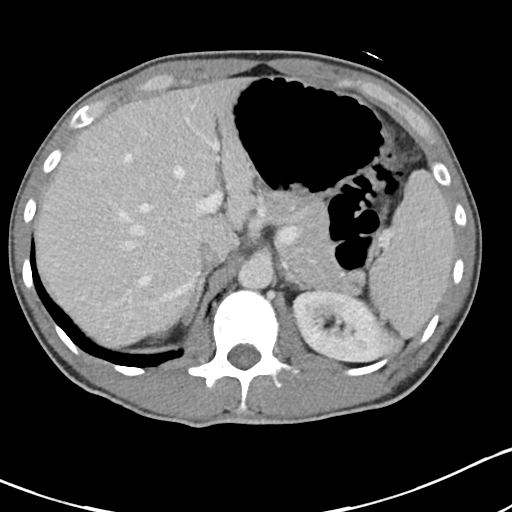
[im 75/89  soft-tissue]
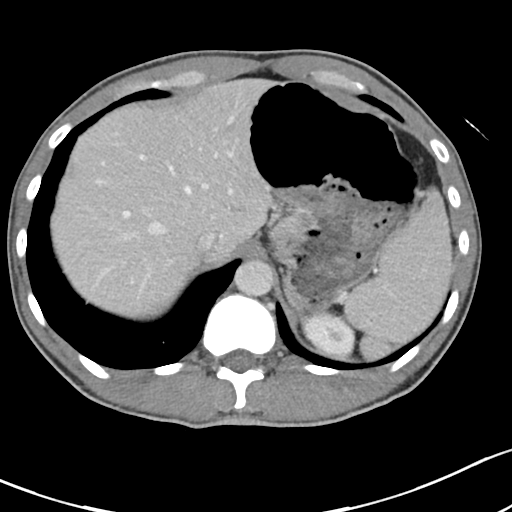
[im 84/89  soft-tissue]
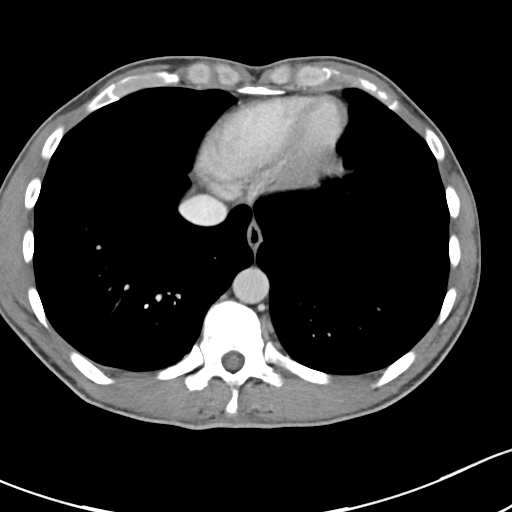

[Series 7: coronal soft tissue · coronal · 0.63mm/px · 3 of 86 slices shown]
[im 29/86  soft-tissue]
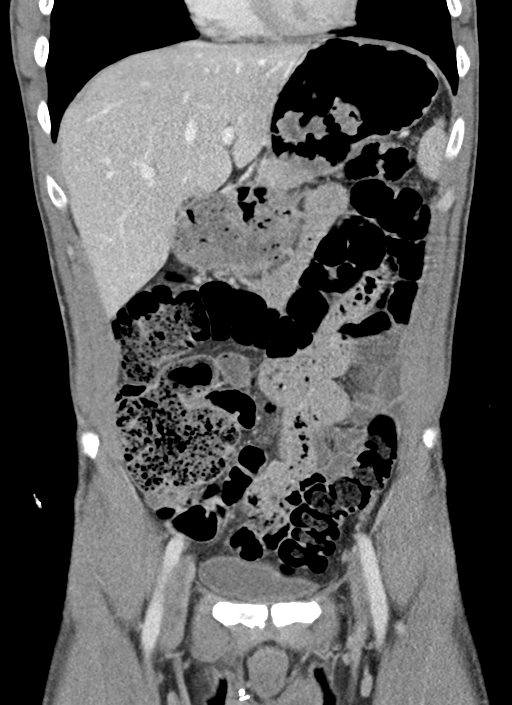
[im 38/86  soft-tissue]
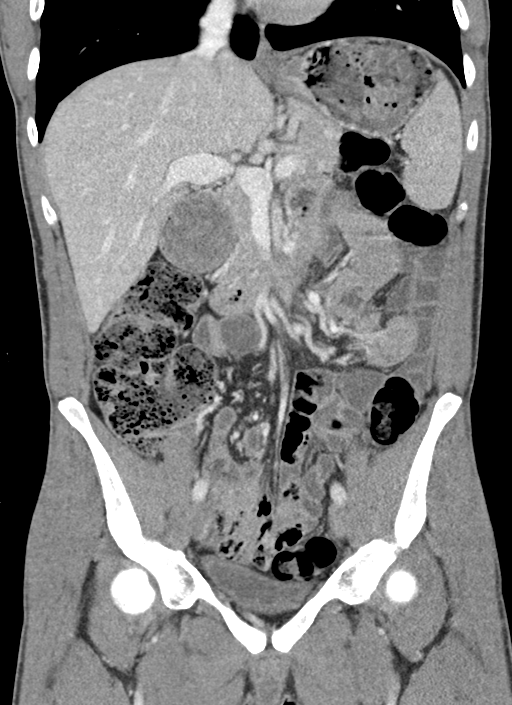
[im 48/86  soft-tissue]
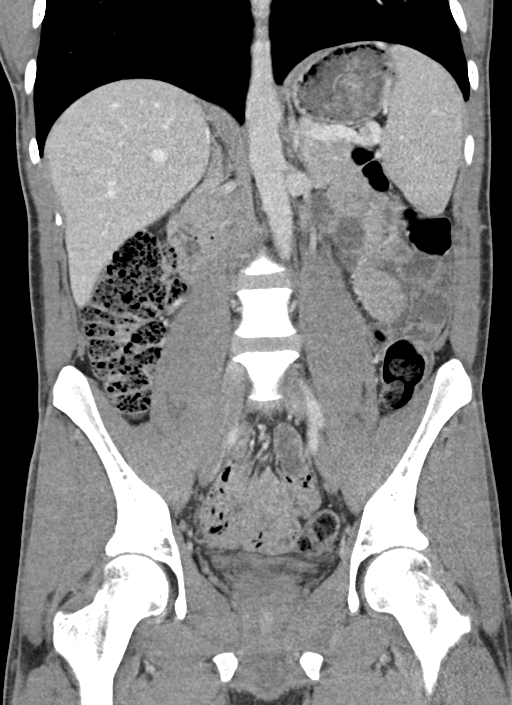

[16 of 46 positions shown; findings below may reference images not displayed]

RADIATION DOSE REDUCTION: This exam was performed according to the
departmental dose-optimization program which includes automated
exposure control, adjustment of the mA and/or kV according to
patient size and/or use of iterative reconstruction technique.

CONTRAST:  100mL OMNIPAQUE IOHEXOL 300 MG/ML  SOLN
FINDINGS: Lower chest: No acute abnormality.

Hepatobiliary: No focal liver abnormality is seen. No gallstones,
gallbladder wall thickening, or biliary dilatation.

Pancreas: Unremarkable. No pancreatic ductal dilatation or
surrounding inflammatory changes.

Spleen: Normal in size without focal abnormality.

Adrenals/Urinary Tract: Adrenal glands are within normal limits.
Kidneys demonstrate a normal enhancement pattern bilaterally. No
renal calculi are noted. No obstructive changes are noted. The
bladder is decompressed.

Stomach/Bowel: No obstructive or inflammatory changes of the colon
are seen. The appendix is well visualized. No inflammatory changes
to suggest appendicitis are noted. Small bowel and stomach appear
within normal limits.

Vascular/Lymphatic: No significant vascular findings are present. No
enlarged abdominal or pelvic lymph nodes.

Reproductive: Prostate is unremarkable.

Other: No abdominal wall hernia or abnormality. No abdominopelvic
ascites.

Musculoskeletal: No acute or significant osseous findings.
IMPRESSION: Normal-appearing appendix.

No focal abnormality is noted.

## 2024-04-28 ENCOUNTER — Encounter: Payer: Self-pay | Admitting: Family Medicine
